# Patient Record
Sex: Female | Born: 1995 | Race: Black or African American | Hispanic: No | Marital: Single | Smoking: Former smoker
Health system: Southern US, Community
[De-identification: ages and names within clinical notes are randomized; demographics above are authoritative.]

---

## 2018-05-03 ENCOUNTER — Emergency Department (HOSPITAL_BASED_OUTPATIENT_CLINIC_OR_DEPARTMENT_OTHER)
Admission: EM | Admit: 2018-05-03 | Discharge: 2018-05-03 | Disposition: A | Discharge status: Home / Self Care | Payer: Medicaid Other | Attending: Emergency Medicine | Admitting: Emergency Medicine

## 2018-05-03 ENCOUNTER — Encounter (HOSPITAL_BASED_OUTPATIENT_CLINIC_OR_DEPARTMENT_OTHER): Payer: Self-pay | Admitting: Emergency Medicine

## 2018-05-03 ENCOUNTER — Other Ambulatory Visit: Payer: Self-pay

## 2018-05-03 DIAGNOSIS — I8393 Asymptomatic varicose veins of bilateral lower extremities: Secondary | ICD-10-CM | POA: Insufficient documentation

## 2018-05-03 DIAGNOSIS — N390 Urinary tract infection, site not specified: Secondary | ICD-10-CM | POA: Insufficient documentation

## 2018-05-03 DIAGNOSIS — R1030 Lower abdominal pain, unspecified: Secondary | ICD-10-CM | POA: Diagnosis present

## 2018-05-03 LAB — URINALYSIS, MICROSCOPIC (REFLEX): RBC / HPF: NONE SEEN RBC/hpf (ref 0–5)

## 2018-05-03 LAB — URINALYSIS, ROUTINE W REFLEX MICROSCOPIC
Bilirubin Urine: NEGATIVE
GLUCOSE, UA: NEGATIVE mg/dL
Hgb urine dipstick: NEGATIVE
KETONES UR: NEGATIVE mg/dL
Nitrite: NEGATIVE
PH: 6 (ref 5.0–8.0)
Protein, ur: NEGATIVE mg/dL
SPECIFIC GRAVITY, URINE: 1.025 (ref 1.005–1.030)

## 2018-05-03 LAB — PREGNANCY, URINE: Preg Test, Ur: NEGATIVE

## 2018-05-03 MED ORDER — NITROFURANTOIN MONOHYD MACRO 100 MG PO CAPS: 100.0000 mg | ORAL_CAPSULE | Freq: Two times a day (BID) | ORAL | 0 refills | Status: DC

## 2018-05-03 MED ORDER — PHENAZOPYRIDINE HCL 100 MG PO TABS
95.0000 mg | ORAL_TABLET | Freq: Once | ORAL | Status: AC
  Administered 2018-05-03: 100 mg via ORAL
  Filled 2018-05-03: qty 1

## 2018-05-03 MED ORDER — PHENAZOPYRIDINE HCL 200 MG PO TABS: 200.0000 mg | ORAL_TABLET | Freq: Three times a day (TID) | ORAL | 0 refills | Status: DC | PRN

## 2018-05-03 MED ORDER — NITROFURANTOIN MONOHYD MACRO 100 MG PO CAPS
100.0000 mg | ORAL_CAPSULE | Freq: Once | ORAL | Status: AC
  Administered 2018-05-03: 100 mg via ORAL
  Filled 2018-05-03: qty 1

## 2018-05-03 NOTE — ED Provider Notes (Signed)
[facility] EMERGENCY DEPARTMENT Provider Note   CSN: [id number] Arrival date & time: 05/03/18  0058     History   Chief Complaint Chief Complaint  Patient presents with  . Abdominal Pain    HPI Joyce Flynn is a 22 y.o. female.  The history is provided by the patient.  Abdominal Pain   This is a new problem. The current episode started yesterday. The problem occurs constantly. The problem has not changed since onset.The pain is associated with an unknown factor. The pain is located in the suprapubic region. The quality of the pain is dull. The pain is moderate. Pertinent negatives include anorexia, fever, belching, diarrhea, flatus, hematochezia, melena, nausea, vomiting, constipation, dysuria, frequency, hematuria, headaches, arthralgias and myalgias. Nothing aggravates the symptoms. Nothing relieves the symptoms. Past workup does not include GI consult. Her past medical history does not include PUD.  Also has chronic leg pain where her spider veins are.  No f/c/r. No vaginal complaints.    History reviewed. No pertinent past medical history.  There are no active problems to display for this patient.   History reviewed. No pertinent surgical history.   OB History   None      Home Medications    Prior to Admission medications   Medication Sig Start Date End Date Taking? Authorizing Provider  nitrofurantoin, macrocrystal-monohydrate, (MACROBID) 100 MG capsule Take 1 capsule (100 mg total) by mouth 2 (two) times daily. X 7 days 05/03/18   Nicanor Alcon, Nimrit Kehres, MD  phenazopyridine (PYRIDIUM) 200 MG tablet Take 1 tablet (200 mg total) by mouth 3 (three) times daily as needed for pain. 05/03/18   Acacia Latorre, MD    Family History No family history on file.  Social History Social History   Tobacco Use  . Smoking status: Never Smoker  . Smokeless tobacco: Never Used  Substance Use Topics  . Alcohol use: Never    Frequency: Never  . Drug use: Never      Allergies   Patient has no known allergies.   Review of Systems Review of Systems  Constitutional: Negative for fever.  Respiratory: Negative for shortness of breath.   Cardiovascular: Negative for chest pain, palpitations and leg swelling.  Gastrointestinal: Positive for abdominal pain. Negative for anorexia, constipation, diarrhea, flatus, hematochezia, melena, nausea and vomiting.  Genitourinary: Negative for dysuria, frequency, hematuria, vaginal bleeding and vaginal discharge.  Musculoskeletal: Negative for arthralgias and myalgias.  Neurological: Negative for headaches.  All other systems reviewed and are negative.    Physical Exam Updated Vital Signs BP (!) 154/86 (BP Location: Right Arm)   Pulse 89   Temp 98.5 F (36.9 C) (Oral)   Resp 18   Ht  (1.676 m)   Wt (!) 142 kg (313 lb)   SpO2 99%   BMI 50.52 kg/m   Physical Exam  Constitutional: She is oriented to person, place, and time. She appears well-developed and well-nourished. No distress.  HENT:  Head: Normocephalic and atraumatic.  Eyes: Pupils are equal, round, and reactive to light. Conjunctivae are normal.  Neck: Normal range of motion. Neck supple.  Cardiovascular: Normal rate, regular rhythm, normal heart sounds and intact distal pulses.  Pulmonary/Chest: Effort normal and breath sounds normal. No stridor. She has no wheezes. She has no rales.  Abdominal: Soft. Normal appearance and bowel sounds are normal. She exhibits no distension and no mass. There is no tenderness. There is no rigidity, no rebound, no guarding, no tenderness at McBurney's point and negative Murphy's  sign. No hernia.  Musculoskeletal: Normal range of motion. She exhibits no edema or tenderness.  Negative Homan's sign  Neurological: She is alert and oriented to person, place, and time. She displays normal reflexes.  Skin: Skin is warm and dry. Capillary refill takes less than 2 seconds.  Psychiatric: She has a normal mood and  affect.  Nursing note and vitals reviewed.    ED Treatments / Results  Labs (all labs ordered are listed, but only abnormal results are displayed) Results for orders placed or performed during the hospital encounter of 05/03/18  Urinalysis, Routine w reflex microscopic  Result Value Ref Range   Color, Urine YELLOW YELLOW   APPearance CLEAR CLEAR   Specific Gravity, Urine 1.025 1.005 - 1.030   pH 6.0 5.0 - 8.0   Glucose, UA NEGATIVE NEGATIVE mg/dL   Hgb urine dipstick NEGATIVE NEGATIVE   Bilirubin Urine NEGATIVE NEGATIVE   Ketones, ur NEGATIVE NEGATIVE mg/dL   Protein, ur NEGATIVE NEGATIVE mg/dL   Nitrite NEGATIVE NEGATIVE   Leukocytes, UA TRACE (A) NEGATIVE  Pregnancy, urine  Result Value Ref Range   Preg Test, Ur NEGATIVE NEGATIVE  Urinalysis, Microscopic (reflex)  Result Value Ref Range   RBC / HPF NONE SEEN 0 - 5 RBC/hpf   WBC, UA 6-10 0 - 5 WBC/hpf   Bacteria, UA MANY (A) NONE SEEN   Squamous Epithelial / LPF 6-10 0 - 5   Mucus PRESENT    No results found.  Radiology No results found.  Procedures Procedures (including critical care time)  Medications Ordered in ED Medications  nitrofurantoin (macrocrystal-monohydrate) (MACROBID) capsule 100 mg (100 mg Oral Given 05/03/18 0253)  phenazopyridine (PYRIDIUM) tablet 100 mg (100 mg Oral Given 05/03/18 0253)      Final Clinical Impressions(s) / ED Diagnoses   Final diagnoses:  Lower urinary tract infectious disease  Spider veins of both lower extremities   Will treat for UTI.  Wear compression stockings for your spider veins.     Return for weakness, numbness, changes in vision or speech, fevers >100.4 unrelieved by medication, shortness of breath, intractable vomiting, or diarrhea, abdominal pain, Inability to tolerate liquids or food, cough, altered mental status or any concerns. No signs of systemic illness or infection. The patient is nontoxic-appearing on exam and vital signs are within normal limits.    I have reviewed the triage vital signs and the nursing notes. Pertinent labs &imaging results that were available during my care of the patient were reviewed by me and considered in my medical decision making (see chart for details).  After history, exam, and medical workup I feel the patient has been appropriately medically screened and is safe for discharge home. Pertinent diagnoses were discussed with the patient. Patient was given return precautions.    ED Discharge Orders        Ordered    nitrofurantoin, macrocrystal-monohydrate, (MACROBID) 100 MG capsule  2 times daily     05/03/18 0246    phenazopyridine (PYRIDIUM) 200 MG tablet  3 times daily PRN     05/03/18 0246       Tanisha Lutes, MD 05/03/18 1610

## 2018-05-03 NOTE — ED Notes (Addendum)
POC discussed with the pt. Pt oriented that there is only one EDP at this time and she will be on her room ASAP. Pt encouraged to change on a hospital gown for better assessment by EDP. Pt verbalized understanding.

## 2018-05-03 NOTE — ED Triage Notes (Signed)
Pt c/o right sided abd pain that started today. Pt also c/o bilateral leg pain that has been going on "for a while."

## 2018-10-22 ENCOUNTER — Emergency Department (HOSPITAL_BASED_OUTPATIENT_CLINIC_OR_DEPARTMENT_OTHER)
Admission: EM | Admit: 2018-10-22 | Discharge: 2018-10-22 | Disposition: A | Discharge status: Home / Self Care | Payer: Medicaid Other | Attending: Emergency Medicine | Admitting: Emergency Medicine

## 2018-10-22 ENCOUNTER — Emergency Department (HOSPITAL_BASED_OUTPATIENT_CLINIC_OR_DEPARTMENT_OTHER): Payer: Medicaid Other

## 2018-10-22 DIAGNOSIS — J029 Acute pharyngitis, unspecified: Secondary | ICD-10-CM | POA: Diagnosis present

## 2018-10-22 DIAGNOSIS — B9789 Other viral agents as the cause of diseases classified elsewhere: Secondary | ICD-10-CM | POA: Diagnosis not present

## 2018-10-22 DIAGNOSIS — J028 Acute pharyngitis due to other specified organisms: Secondary | ICD-10-CM | POA: Insufficient documentation

## 2018-10-22 LAB — GROUP A STREP BY PCR: Group A Strep by PCR: NOT DETECTED

## 2018-10-22 MED ORDER — DEXAMETHASONE SODIUM PHOSPHATE 10 MG/ML IJ SOLN
10.0000 mg | Freq: Once | INTRAMUSCULAR | Status: AC
Start: 2018-10-22 — End: 2018-10-22
  Administered 2018-10-22: 10 mg via INTRAMUSCULAR
  Filled 2018-10-22: qty 1

## 2018-10-22 NOTE — ED Notes (Signed)
Pt understood dc material. NAD noted. 

## 2018-10-22 NOTE — ED Triage Notes (Signed)
Pt states that she has had a sore throat and swollen throat for two days. Denies fevers, chills, N/V. Has had a runny nose and some cough. Hx of tonsillitis and strep. No OTC med used

## 2018-10-22 NOTE — ED Notes (Signed)
Patient transported to X-ray 

## 2018-10-22 NOTE — Discharge Instructions (Addendum)
Your strep test is negative.  Keep yourself hydrated.  Use Tylenol or ibuprofen as needed for pain and fever.  Return to the ED with difficulty breathing, difficulty swallowing or other concerns.

## 2018-10-22 NOTE — ED Provider Notes (Signed)
[facility] EMERGENCY DEPARTMENT Provider Note   CSN: [id number] Arrival date & time: 10/22/18  0306     History   Chief Complaint Chief Complaint  Patient presents with  . Sore Throat    HPI Joyce Flynn is a 22 y.o. female.  Patient reports 3-day history of sore throat and pain with swallowing.  She been taking ibuprofen at home without relief.  Also has had a runny nose and a nonproductive cough.  Has has tonsillitis and strep in the past.  Denies fever, chills, nausea or vomiting.  No sick contacts.  No difficulty breathing but does have pain with swallowing. Denies any headache, abdominal pain, chest pain or shortness of breath.  The history is provided by the patient.  Sore Throat  Pertinent negatives include no chest pain, no abdominal pain and no shortness of breath.    No past medical history on file.  There are no active problems to display for this patient.   No past surgical history on file.   OB History   None      Home Medications    Prior to Admission medications   Medication Sig Start Date End Date Taking? Authorizing Provider  nitrofurantoin, macrocrystal-monohydrate, (MACROBID) 100 MG capsule Take 1 capsule (100 mg total) by mouth 2 (two) times daily. X 7 days 05/03/18   Nicanor Alcon, April, MD  phenazopyridine (PYRIDIUM) 200 MG tablet Take 1 tablet (200 mg total) by mouth 3 (three) times daily as needed for pain. 05/03/18   Palumbo, April, MD    Family History No family history on file.  Social History Social History   Tobacco Use  . Smoking status: Never Smoker  . Smokeless tobacco: Never Used  Substance Use Topics  . Alcohol use: Never    Frequency: Never  . Drug use: Never     Allergies   Patient has no known allergies.   Review of Systems Review of Systems  Constitutional: Negative for activity change, appetite change and fever.  HENT: Positive for congestion, rhinorrhea, sore throat and trouble swallowing.   Eyes:  Negative for visual disturbance.  Respiratory: Positive for cough. Negative for shortness of breath.   Cardiovascular: Negative for chest pain.  Gastrointestinal: Negative for abdominal pain, nausea and vomiting.  Genitourinary: Negative for dysuria, hematuria, vaginal bleeding and vaginal discharge.  Musculoskeletal: Negative for arthralgias and myalgias.  Neurological: Negative for dizziness, weakness and light-headedness.    all other systems are negative except as noted in the HPI and PMH.    Physical Exam Updated Vital Signs BP (!) 148/88 (BP Location: Left Arm)   Pulse 82   Temp 98.2 F (36.8 C) (Oral)   Resp 20   Ht 5\' 6"  (1.676 m)   Wt (!) 144 kg   SpO2 100%   BMI 51.24 kg/m   Physical Exam  Constitutional: She is oriented to person, place, and time. She appears well-developed and well-nourished. No distress.  HENT:  Head: Normocephalic and atraumatic.  Mouth/Throat: Oropharynx is clear and moist. No oropharyngeal exudate.  Uvula midline, erythema present without exudate, no asymmetry, floor mouth is soft No swelling of soft palate  Eyes: Pupils are equal, round, and reactive to light. Conjunctivae and EOM are normal.  Neck: Normal range of motion. Neck supple.  No meningismus.  Cardiovascular: Normal rate, regular rhythm, normal heart sounds and intact distal pulses.  No murmur heard. Pulmonary/Chest: Effort normal and breath sounds normal. No respiratory distress. She exhibits no tenderness.  Abdominal: Soft. There  is no tenderness. There is no rebound and no guarding.  Musculoskeletal: Normal range of motion. She exhibits no edema or tenderness.  Lymphadenopathy:    She has cervical adenopathy.  Neurological: She is alert and oriented to person, place, and time. No cranial nerve deficit. She exhibits normal muscle tone. Coordination normal.   5/5 strength throughout. CN 2-12 intact.Equal grip strength.   Skin: Skin is warm.  Psychiatric: She has a normal mood  and affect. Her behavior is normal.  Nursing note and vitals reviewed.    ED Treatments / Results  Labs (all labs ordered are listed, but only abnormal results are displayed) Labs Reviewed  GROUP A STREP BY PCR    EKG None  Radiology Dg Neck Soft Tissue  Result Date: 10/22/2018 CLINICAL DATA:  22 year old female with sore throat. EXAM: NECK SOFT TISSUES - 1+ VIEW COMPARISON:  None. FINDINGS: There is no evidence of retropharyngeal soft tissue swelling or epiglottic enlargement. The cervical airway is unremarkable and no radio-opaque foreign body identified. IMPRESSION: Negative. Electronically Signed   By: Elgie Collard M.D.   On: 10/22/2018 04:46    Procedures Procedures (including critical care time)  Medications Ordered in ED Medications - No data to display   Initial Impression / Assessment and Plan / ED Course  I have reviewed the triage vital signs and the nursing notes.  Pertinent labs & imaging results that were available during my care of the patient were reviewed by me and considered in my medical decision making (see chart for details).    3 days of sore throat pain with swallowing.  Uvula midline.  No evidence of peritonsillar abscess.  Rapid strep is negative.  Soft tissue neck x-ray is negative.  Patient controlling secretions and in no distress. IM Decadron given  We will treat supportively for likely viral pharyngitis.  Follow-up with PCP.  Return precautions discussed.  Final Clinical Impressions(s) / ED Diagnoses   Final diagnoses:  Viral pharyngitis    ED Discharge Orders    None       Meha Vidrine, Jeannett Senior, MD 10/22/18 [phone]

## 2018-10-25 ENCOUNTER — Emergency Department (HOSPITAL_BASED_OUTPATIENT_CLINIC_OR_DEPARTMENT_OTHER)
Admission: EM | Admit: 2018-10-25 | Discharge: 2018-10-25 | Disposition: A | Discharge status: Home / Self Care | Payer: Medicaid Other | Attending: Emergency Medicine | Admitting: Emergency Medicine

## 2018-10-25 ENCOUNTER — Other Ambulatory Visit: Payer: Self-pay

## 2018-10-25 ENCOUNTER — Encounter (HOSPITAL_BASED_OUTPATIENT_CLINIC_OR_DEPARTMENT_OTHER): Payer: Self-pay | Admitting: Emergency Medicine

## 2018-10-25 DIAGNOSIS — J029 Acute pharyngitis, unspecified: Secondary | ICD-10-CM | POA: Insufficient documentation

## 2018-10-25 DIAGNOSIS — R070 Pain in throat: Secondary | ICD-10-CM | POA: Diagnosis present

## 2018-10-25 LAB — COMPREHENSIVE METABOLIC PANEL
ALT: 28 U/L (ref 0–44)
ANION GAP: 8 (ref 5–15)
AST: 26 U/L (ref 15–41)
Albumin: 3.7 g/dL (ref 3.5–5.0)
Alkaline Phosphatase: 54 U/L (ref 38–126)
BUN: 15 mg/dL (ref 6–20)
CALCIUM: 8.5 mg/dL — AB (ref 8.9–10.3)
CHLORIDE: 107 mmol/L (ref 98–111)
CO2: 23 mmol/L (ref 22–32)
CREATININE: 0.88 mg/dL (ref 0.44–1.00)
GFR calc non Af Amer: >60 mL/min (ref >60)
Glucose, Bld: 84 mg/dL (ref 70–99)
Potassium: 3.8 mmol/L (ref 3.5–5.1)
SODIUM: 138 mmol/L (ref 135–145)
Total Bilirubin: 0.6 mg/dL (ref 0.3–1.2)
Total Protein: 7.6 g/dL (ref 6.5–8.1)

## 2018-10-25 LAB — CBC WITH DIFFERENTIAL/PLATELET
Abs Immature Granulocytes: 0.03 10*3/uL (ref 0.00–0.07)
BASOS ABS: 0 10*3/uL (ref 0.0–0.1)
BASOS PCT: 0 %
EOS ABS: 0.2 10*3/uL (ref 0.0–0.5)
EOS PCT: 2 %
HCT: 41.5 % (ref 36.0–46.0)
Hemoglobin: 12.7 g/dL (ref 12.0–15.0)
Immature Granulocytes: 0 %
LYMPHS ABS: 4.9 10*3/uL — AB (ref 0.7–4.0)
LYMPHS PCT: 41 %
MCH: 27.1 pg (ref 26.0–34.0)
MCHC: 30.6 g/dL (ref 30.0–36.0)
MCV: 88.7 fL (ref 80.0–100.0)
Monocytes Absolute: 0.8 10*3/uL (ref 0.1–1.0)
Monocytes Relative: 7 %
NRBC: 0 % (ref 0.0–0.2)
Neutro Abs: 6 10*3/uL (ref 1.7–7.7)
Neutrophils Relative %: 50 %
Platelets: 209 10*3/uL (ref 150–400)
RBC: 4.68 MIL/uL (ref 3.87–5.11)
RDW: 12.6 % (ref 11.5–15.5)
WBC: 12 10*3/uL — ABNORMAL HIGH (ref 4.0–10.5)

## 2018-10-25 LAB — MONONUCLEOSIS SCREEN: MONO SCREEN: NEGATIVE

## 2018-10-25 MED ORDER — AMOXICILLIN 500 MG PO CAPS: 500.0000 mg | ORAL_CAPSULE | Freq: Two times a day (BID) | ORAL | 0 refills | Status: DC

## 2018-10-25 NOTE — ED Triage Notes (Signed)
Pt states she was seen last Friday for sore throat and got a steroid shot, feel well on Saturday but now her sore throat is worse.

## 2018-10-25 NOTE — ED Provider Notes (Signed)
[facility] EMERGENCY DEPARTMENT Provider Note   CSN: [id number] Arrival date & time: 10/25/18  0152     History   Chief Complaint Chief Complaint  Patient presents with  . Sore Throat    HPI Joyce Flynn is a 22 y.o. female.  Patient returns with sore throat.  Seen by myself 3 days ago for sore throat with negative strep test.  Was treated with steroids and supportive care for likely viral pharyngitis.  States she felt better for about 1 day then started hurting her throat again.  She describes pain in the center of her throat that is worse with swallowing.  Has had cough and runny nose as well.  No fever, chills, nausea or vomiting.  No chest pain or shortness of breath.  No sick contacts or rashes.  No abdominal pain or headache. Noticed that her right eye was red when she woke up yesterday morning. No visual changes.  The history is provided by the patient.  Sore Throat  Pertinent negatives include no chest pain, no abdominal pain and no headaches.    History reviewed. No pertinent past medical history.  There are no active problems to display for this patient.   History reviewed. No pertinent surgical history.   OB History   None      Home Medications    Prior to Admission medications   Medication Sig Start Date End Date Taking? Authorizing Provider  nitrofurantoin, macrocrystal-monohydrate, (MACROBID) 100 MG capsule Take 1 capsule (100 mg total) by mouth 2 (two) times daily. X 7 days 05/03/18   Nicanor Alcon, April, MD  phenazopyridine (PYRIDIUM) 200 MG tablet Take 1 tablet (200 mg total) by mouth 3 (three) times daily as needed for pain. 05/03/18   Palumbo, April, MD    Family History No family history on file.  Social History Social History   Tobacco Use  . Smoking status: Never Smoker  . Smokeless tobacco: Never Used  Substance Use Topics  . Alcohol use: Never    Frequency: Never  . Drug use: Never     Allergies   Patient has no known  allergies.   Review of Systems Review of Systems  Constitutional: Negative for activity change, appetite change and fatigue.  HENT: Positive for congestion, rhinorrhea, sore throat and trouble swallowing.   Respiratory: Positive for cough. Negative for chest tightness.   Cardiovascular: Negative for chest pain and leg swelling.  Gastrointestinal: Negative for abdominal pain, nausea and vomiting.  Genitourinary: Negative for dysuria and hematuria.  Musculoskeletal: Negative for arthralgias and myalgias.  Skin: Negative for rash.  Neurological: Negative for dizziness, weakness and headaches.    all other systems are negative except as noted in the HPI and PMH.    Physical Exam Updated Vital Signs BP (!) 146/79 (BP Location: Right Arm)   Pulse 95   Temp 98.1 F (36.7 C) (Oral)   Ht 5\' 6"  (1.676 m)   Wt (!) 142 kg   LMP 10/19/2018   SpO2 100%   BMI 50.52 kg/m   Physical Exam  Constitutional: She is oriented to person, place, and time. She appears well-developed and well-nourished. No distress.  No distress. Speaking in full sentences  HENT:  Head: Normocephalic and atraumatic.  Mouth/Throat: Oropharynx is clear and moist. No oropharyngeal exudate.  Erythematous oropharynx.  No asymmetry or bulging of soft palate.  Uvula is midline.  No exudate.  Floor of mouth is soft.  Eyes: Pupils are equal, round, and reactive to light. Conjunctivae  and EOM are normal.  Neck: Normal range of motion. Neck supple.  No meningismus.  Cardiovascular: Normal rate, regular rhythm, normal heart sounds and intact distal pulses.  No murmur heard. Pulmonary/Chest: Effort normal and breath sounds normal. No respiratory distress. She exhibits no tenderness.  Abdominal: Soft. There is no tenderness. There is no rebound and no guarding.  Musculoskeletal: Normal range of motion. She exhibits no edema or tenderness.  Neurological: She is alert and oriented to person, place, and time. No cranial nerve  deficit. She exhibits normal muscle tone. Coordination normal.  No ataxia on finger to nose bilaterally. No pronator drift. 5/5 strength throughout. CN 2-12 intact.Equal grip strength. Sensation intact.   Skin: Skin is warm.  Psychiatric: She has a normal mood and affect. Her behavior is normal.  Nursing note and vitals reviewed.    ED Treatments / Results  Labs (all labs ordered are listed, but only abnormal results are displayed) Labs Reviewed  CBC WITH DIFFERENTIAL/PLATELET - Abnormal; Notable for the following components:      Result Value   WBC 12.0 (*)    Lymphs Abs 4.9 (*)    All other components within normal limits  COMPREHENSIVE METABOLIC PANEL - Abnormal; Notable for the following components:   Calcium 8.5 (*)    All other components within normal limits  MONONUCLEOSIS SCREEN    EKG None  Radiology No results found.  Procedures Procedures (including critical care time)  Medications Ordered in ED Medications - No data to display   Initial Impression / Assessment and Plan / ED Course  I have reviewed the triage vital signs and the nursing notes.  Pertinent labs & imaging results that were available during my care of the patient were reviewed by me and considered in my medical decision making (see chart for details).    Return visit for sore throat.  Did feel better for 1 day but symptoms returned.  No difficulty breathing or difficulty swallowing.  No evidence of peritonsillar abscess on exam. Soft tissue Xray negative on 11/2  Rapid strep test from 2 days ago was negative.  Lab work obtained today shows leukocytosis.  Monospot is negative.  Patient in no distress.  No evidence of peritonsillar abscess on exam.  Discussed with patient the next step in evaluation will be CT scan to evaluate for deep space infection but this seems unlikely on exam.  She has no change in voice, stridor, difficulty with her secretions or drooling and no asymmetry of her  oropharynx.  Risks and benefits of CT scan discussed and patient declines at this time. Advised p.o. hydration, antipyretics and PCP follow-up.  Will also give wait-and-see prescription of antibiotics if she is not improving in 2 days. Return to the ED with difficulty breathing, difficulty swallowing or any other concerns. Final Clinical Impressions(s) / ED Diagnoses   Final diagnoses:  Pharyngitis, unspecified etiology    ED Discharge Orders    None       Amerika Nourse, Jeannett Senior, MD 10/25/18 [phone]

## 2018-10-25 NOTE — Discharge Instructions (Signed)
Keep yourself hydrated.  Use Tylenol or ibuprofen as needed for pain.  As we discussed, your sore throat is likely viral.  However if you are still having pain in 2 days you may start the antibiotics.  Return to the ED with difficulty swallowing, difficulty breathing, any other concerns.

## 2019-01-06 ENCOUNTER — Encounter (HOSPITAL_BASED_OUTPATIENT_CLINIC_OR_DEPARTMENT_OTHER): Payer: Self-pay

## 2019-01-06 ENCOUNTER — Emergency Department (HOSPITAL_BASED_OUTPATIENT_CLINIC_OR_DEPARTMENT_OTHER)
Admission: EM | Admit: 2019-01-06 | Discharge: 2019-01-07 | Disposition: A | Discharge status: Home / Self Care | Payer: Medicaid Other | Attending: Emergency Medicine | Admitting: Emergency Medicine

## 2019-01-06 ENCOUNTER — Other Ambulatory Visit: Payer: Self-pay

## 2019-01-06 DIAGNOSIS — N898 Other specified noninflammatory disorders of vagina: Secondary | ICD-10-CM | POA: Diagnosis not present

## 2019-01-06 DIAGNOSIS — R102 Pelvic and perineal pain: Secondary | ICD-10-CM | POA: Diagnosis present

## 2019-01-06 DIAGNOSIS — F17200 Nicotine dependence, unspecified, uncomplicated: Secondary | ICD-10-CM | POA: Insufficient documentation

## 2019-01-06 DIAGNOSIS — B9689 Other specified bacterial agents as the cause of diseases classified elsewhere: Secondary | ICD-10-CM | POA: Diagnosis not present

## 2019-01-06 DIAGNOSIS — N760 Acute vaginitis: Secondary | ICD-10-CM | POA: Diagnosis not present

## 2019-01-06 LAB — URINALYSIS, MICROSCOPIC (REFLEX)

## 2019-01-06 LAB — URINALYSIS, ROUTINE W REFLEX MICROSCOPIC
BILIRUBIN URINE: NEGATIVE
Glucose, UA: NEGATIVE mg/dL
HGB URINE DIPSTICK: NEGATIVE
Ketones, ur: NEGATIVE mg/dL
Nitrite: NEGATIVE
PH: 7 (ref 5.0–8.0)
Protein, ur: NEGATIVE mg/dL
SPECIFIC GRAVITY, URINE: 1.02 (ref 1.005–1.030)

## 2019-01-06 LAB — WET PREP, GENITAL
Sperm: NONE SEEN
Trich, Wet Prep: NONE SEEN
Yeast Wet Prep HPF POC: NONE SEEN

## 2019-01-06 LAB — PREGNANCY, URINE: Preg Test, Ur: NEGATIVE

## 2019-01-06 NOTE — ED Triage Notes (Addendum)
C/o vaginal sores x 9 days-vaginal d/c x 5 days-pt seen at [facility] last [facility]-sores/discharge not assessed-dx with UTI-completed abx today-NAD-steady gait

## 2019-01-06 NOTE — ED Notes (Signed)
Pt instructed to undress for exam. Pelvic cart at bedside

## 2019-01-07 MED ORDER — METRONIDAZOLE 500 MG PO TABS: 500.0000 mg | ORAL_TABLET | Freq: Two times a day (BID) | ORAL | 0 refills | Status: AC

## 2019-01-07 MED ORDER — ACYCLOVIR 400 MG PO TABS: 400.0000 mg | ORAL_TABLET | Freq: Three times a day (TID) | ORAL | 0 refills | Status: AC

## 2019-01-07 NOTE — ED Provider Notes (Signed)
[facility] EMERGENCY DEPARTMENT Provider Note   CSN: [id number] Arrival date & time: 01/06/19  2209     History   Chief Complaint Chief Complaint  Patient presents with  . Vaginal sores    HPI Joyce Flynn is a 23 y.o. female.  The history is provided by the patient.  Patient presents with vaginal pain and sores for up to 9 days.  She reports it is worsening.  Hurts worse to palpation, movement and urination.  She was seen recently and treated for UTI which she has completed antibiotics.  No fever/vomiting.  No vaginal bleeding.  Minimal vaginal discharge.  She is sexually active with multiple partners.  She does report using condoms  OB History   No obstetric history on file.      Home Medications    Prior to Admission medications   Medication Sig Start Date End Date Taking? Authorizing Provider  acyclovir (ZOVIRAX) 400 MG tablet Take 1 tablet (400 mg total) by mouth 3 (three) times daily. 01/07/19   Zadie Rhine, MD  metroNIDAZOLE (FLAGYL) 500 MG tablet Take 1 tablet (500 mg total) by mouth 2 (two) times daily. One po bid x 7 days 01/07/19   Zadie Rhine, MD    Family History No family history on file.  Social History Social History   Tobacco Use  . Smoking status: Current Every Day Smoker  . Smokeless tobacco: Never Used  Substance Use Topics  . Alcohol use: Never    Frequency: Never  . Drug use: Never     Allergies   Patient has no known allergies.   Review of Systems Review of Systems  Constitutional: Negative for fever.  Gastrointestinal: Negative for vomiting.  Genitourinary: Positive for vaginal pain. Negative for vaginal bleeding.  All other systems reviewed and are negative.    Physical Exam Updated Vital Signs BP 140/90 (BP Location: Left Arm)   Pulse 94   Temp 98.1 F (36.7 C) (Oral)   Resp 20   Ht 1.676 m (5\' 6" )   Wt (!) 140.2 kg   LMP 11/22/2018   SpO2 99%   BMI 49.87 kg/m   Physical Exam  CONSTITUTIONAL:  Well developed/well nourished HEAD: Normocephalic/atraumatic EYES: EOMI/PERRL ENMT: Mucous membranes moist NECK: supple no meningeal signs SPINE/BACK:entire spine nontender CV: S1/S2 noted, no murmurs/rubs/gallops noted LUNGS: Lungs are clear to auscultation bilaterally, no apparent distress ABDOMEN: soft, nontender, no rebound or guarding, bowel sounds noted throughout abdomen GU:no cva tenderness Multiple lesions noted in vaginal region consistent with herpes.  Whitish discharge noted.  No vaginal bleeding.  No CMT.  Female chaperone present NEURO: Pt is awake/alert/appropriate, moves all extremitiesx4.  No facial droop.   EXTREMITIES: pulses normal/equal, full ROM SKIN: warm, color normal PSYCH: no abnormalities of mood noted, alert and oriented to situation  ED Treatments / Results  Labs (all labs ordered are listed, but only abnormal results are displayed) Labs Reviewed  WET PREP, GENITAL - Abnormal; Notable for the following components:      Result Value   Clue Cells Wet Prep HPF POC PRESENT (*)    WBC, Wet Prep HPF POC MANY (*)    All other components within normal limits  URINALYSIS, ROUTINE W REFLEX MICROSCOPIC - Abnormal; Notable for the following components:   APPearance HAZY (*)    Leukocytes, UA TRACE (*)    All other components within normal limits  URINALYSIS, MICROSCOPIC (REFLEX) - Abnormal; Notable for the following components:   Bacteria, UA MANY (*)  All other components within normal limits  HSV CULTURE AND TYPING  PREGNANCY, URINE  HIV ANTIBODY (ROUTINE TESTING W REFLEX)  RPR  GC/CHLAMYDIA PROBE AMP ([facility]) NOT AT [facility]    EKG None  Radiology No results found.  Procedures Procedures (including critical care time)  Medications Ordered in ED Medications - No data to display   Initial Impression / Assessment and Plan / ED Course  I have reviewed the triage vital signs and the nursing notes.  Pertinent labs results that were available  during my care of the patient were reviewed by me and considered in my medical decision making (see chart for details).     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  Will start acyclovir and Flagyl.  Extensive STD testing still pending.  Advised to abstain from sexual activity.  She needs close follow-up with OB/GYN as she has never had a Pap smear. We discussed safe sex precautions.  Final Clinical Impressions(s) / ED Diagnoses   Final diagnoses:  Acute vaginitis  BV (bacterial vaginosis)    ED Discharge Orders         Ordered    metroNIDAZOLE (FLAGYL) 500 MG tablet  2 times daily     01/07/19 0007    acyclovir (ZOVIRAX) 400 MG tablet  3 times daily     01/07/19 0007           Zadie Rhine, MD 01/07/19 0015

## 2019-01-07 NOTE — ED Notes (Signed)
Pt understood dc material. NAD noted. Scripts sent in electronically  

## 2019-01-08 LAB — RPR: RPR: NONREACTIVE

## 2019-01-08 LAB — HIV ANTIBODY (ROUTINE TESTING W REFLEX): HIV SCREEN 4TH GENERATION: NONREACTIVE

## 2019-01-09 LAB — HSV CULTURE AND TYPING

## 2019-01-09 LAB — GC/CHLAMYDIA PROBE AMP (~~LOC~~) NOT AT ARMC
CHLAMYDIA, DNA PROBE: NEGATIVE
Neisseria Gonorrhea: NEGATIVE

## 2021-05-16 ENCOUNTER — Emergency Department (HOSPITAL_BASED_OUTPATIENT_CLINIC_OR_DEPARTMENT_OTHER)
Admission: EM | Admit: 2021-05-16 | Discharge: 2021-05-16 | Disposition: A | Discharge status: Home / Self Care | Payer: Medicaid Other | Attending: Emergency Medicine | Admitting: Emergency Medicine

## 2021-05-16 ENCOUNTER — Other Ambulatory Visit: Payer: Self-pay

## 2021-05-16 ENCOUNTER — Encounter (HOSPITAL_BASED_OUTPATIENT_CLINIC_OR_DEPARTMENT_OTHER): Payer: Self-pay | Admitting: *Deleted

## 2021-05-16 DIAGNOSIS — Z87891 Personal history of nicotine dependence: Secondary | ICD-10-CM | POA: Insufficient documentation

## 2021-05-16 DIAGNOSIS — J029 Acute pharyngitis, unspecified: Secondary | ICD-10-CM | POA: Insufficient documentation

## 2021-05-16 DIAGNOSIS — R070 Pain in throat: Secondary | ICD-10-CM | POA: Diagnosis present

## 2021-05-16 MED ORDER — DEXAMETHASONE 6 MG PO TABS
10.0000 mg | ORAL_TABLET | Freq: Once | ORAL | Status: AC
  Administered 2021-05-16: 10 mg via ORAL
  Filled 2021-05-16: qty 1

## 2021-05-16 MED ORDER — PENICILLIN G BENZATHINE & PROC 1200000 UNIT/2ML IM SUSP
1.2000 10*6.[IU] | Freq: Once | INTRAMUSCULAR | Status: AC
  Administered 2021-05-16: 1.2 10*6.[IU] via INTRAMUSCULAR
  Filled 2021-05-16: qty 2

## 2021-05-16 NOTE — ED Provider Notes (Signed)
[facility] EMERGENCY DEPARTMENT Provider Note   CSN: [id number] Arrival date & time: 05/16/21  2012     History Chief Complaint  Patient presents with  . Sore Throat    Joyce Flynn is a 25 y.o. female.  Sore throat, no fever.  No voice change.  The history is provided by the patient.  Sore Throat This is a new problem. The problem occurs constantly. The problem has not changed since onset.Nothing aggravates the symptoms. Nothing relieves the symptoms. She has tried nothing for the symptoms. The treatment provided no relief.       History reviewed. No pertinent past medical history.  There are no problems to display for this patient.   History reviewed. No pertinent surgical history.   OB History   No obstetric history on file.     No family history on file.  Social History   Tobacco Use  . Smoking status: Former Games developer  . Smokeless tobacco: Never Used  Vaping Use  . Vaping Use: Never used  Substance Use Topics  . Alcohol use: Yes  . Drug use: Never    Home Medications Prior to Admission medications   Medication Sig Start Date End Date Taking? Authorizing Provider  acyclovir (ZOVIRAX) 400 MG tablet Take 1 tablet (400 mg total) by mouth 3 (three) times daily. 01/07/19   Zadie Rhine, MD  metroNIDAZOLE (FLAGYL) 500 MG tablet Take 1 tablet (500 mg total) by mouth 2 (two) times daily. One po bid x 7 days 01/07/19   Zadie Rhine, MD  valACYclovir (VALTREX) 500 MG tablet Take by mouth. 03/18/20   [provider]    Allergies    Patient has no known allergies.  Review of Systems   Review of Systems  Constitutional: Negative for fever.  HENT: Positive for sore throat. Negative for congestion, ear pain, facial swelling, sinus pressure, sinus pain, trouble swallowing and voice change.     Physical Exam Updated Vital Signs BP (!) 139/93 (BP Location: Right Arm)   Pulse (!) 107   Temp 99.6 F (37.6 C) (Oral)   Resp 20   Ht 5\' 6"   (1.676 m)   Wt (!) 145.7 kg   LMP 05/02/2021   SpO2 98%   BMI 51.84 kg/m   Physical Exam Constitutional:      General: She is not in acute distress.    Appearance: She is not ill-appearing.  HENT:     Head: Normocephalic and atraumatic.     Mouth/Throat:     Mouth: Mucous membranes are moist. No oral lesions.     Pharynx: Uvula midline. Oropharyngeal exudate and posterior oropharyngeal erythema present. No pharyngeal swelling or uvula swelling.     Tonsils: Tonsillar exudate present. No tonsillar abscesses. 2+ on the right. 2+ on the left.  Eyes:     Conjunctiva/sclera: Conjunctivae normal.  Musculoskeletal:     Cervical back: Normal range of motion.  Neurological:     Mental Status: She is alert.     ED Results / Procedures / Treatments   Labs (all labs ordered are listed, but only abnormal results are displayed) Labs Reviewed - No data to display  EKG None  Radiology No results found.  Procedures Procedures   Medications Ordered in ED Medications  penicillin g procaine-penicillin g benzathine (BICILLIN-CR) injection 600000-600000 units (1.2 Million Units Intramuscular Given 05/16/21 2030)  dexamethasone (DECADRON) tablet 10 mg (10 mg Oral Given 05/16/21 2030)    ED Course  I have reviewed the triage  vital signs and the nursing notes.  Pertinent labs & imaging results that were available during my care of the patient were reviewed by me and considered in my medical decision making (see chart for details).    MDM Rules/Calculators/A&P                          Joyce Flynn is here with sore throat.  Appears to have pharyngitis.  Will cover for strep with pen G shot.  Will give Decadron.  No concern for abscess or deeper space infection.  Discharged in good condition.  This chart was dictated using voice recognition software.  Despite best efforts to proofread,  errors can occur which can change the documentation meaning.   Final Clinical Impression(s) / ED  Diagnoses Final diagnoses:  Pharyngitis, unspecified etiology    Rx / DC Orders ED Discharge Orders    None       Virgina Norfolk, DO 05/16/21 2035

## 2021-05-16 NOTE — ED Triage Notes (Signed)
Sore throat x 2 days

## 2022-01-16 ENCOUNTER — Encounter (HOSPITAL_BASED_OUTPATIENT_CLINIC_OR_DEPARTMENT_OTHER): Payer: Self-pay

## 2022-01-16 ENCOUNTER — Other Ambulatory Visit: Payer: Self-pay

## 2022-01-16 ENCOUNTER — Emergency Department (HOSPITAL_BASED_OUTPATIENT_CLINIC_OR_DEPARTMENT_OTHER)
Admission: EM | Admit: 2022-01-16 | Discharge: 2022-01-16 | Disposition: A | Discharge status: Home / Self Care | Payer: BC Managed Care – PPO | Attending: Emergency Medicine | Admitting: Emergency Medicine

## 2022-01-16 DIAGNOSIS — R112 Nausea with vomiting, unspecified: Secondary | ICD-10-CM | POA: Insufficient documentation

## 2022-01-16 DIAGNOSIS — D72829 Elevated white blood cell count, unspecified: Secondary | ICD-10-CM | POA: Diagnosis not present

## 2022-01-16 DIAGNOSIS — R197 Diarrhea, unspecified: Secondary | ICD-10-CM | POA: Insufficient documentation

## 2022-01-16 DIAGNOSIS — R1010 Upper abdominal pain, unspecified: Secondary | ICD-10-CM | POA: Insufficient documentation

## 2022-01-16 LAB — CBC WITH DIFFERENTIAL/PLATELET
Abs Immature Granulocytes: 0.03 10*3/uL (ref 0.00–0.07)
Basophils Absolute: 0 10*3/uL (ref 0.0–0.1)
Basophils Relative: 0 %
Eosinophils Absolute: 0 10*3/uL (ref 0.0–0.5)
Eosinophils Relative: 0 %
HCT: 44.1 % (ref 36.0–46.0)
Hemoglobin: 14.5 g/dL (ref 12.0–15.0)
Immature Granulocytes: 0 %
Lymphocytes Relative: 10 %
Lymphs Abs: 1.3 10*3/uL (ref 0.7–4.0)
MCH: 28.4 pg (ref 26.0–34.0)
MCHC: 32.9 g/dL (ref 30.0–36.0)
MCV: 86.3 fL (ref 80.0–100.0)
Monocytes Absolute: 0.8 10*3/uL (ref 0.1–1.0)
Monocytes Relative: 6 %
Neutro Abs: 10.3 10*3/uL — ABNORMAL HIGH (ref 1.7–7.7)
Neutrophils Relative %: 84 %
Platelets: 206 10*3/uL (ref 150–400)
RBC: 5.11 MIL/uL (ref 3.87–5.11)
RDW: 12.5 % (ref 11.5–15.5)
WBC: 12.4 10*3/uL — ABNORMAL HIGH (ref 4.0–10.5)
nRBC: 0 % (ref 0.0–0.2)

## 2022-01-16 LAB — LIPASE, BLOOD: Lipase: 28 U/L (ref 11–51)

## 2022-01-16 LAB — COMPREHENSIVE METABOLIC PANEL
ALT: 24 U/L (ref 0–44)
AST: 24 U/L (ref 15–41)
Albumin: 3.9 g/dL (ref 3.5–5.0)
Alkaline Phosphatase: 51 U/L (ref 38–126)
Anion gap: 9 (ref 5–15)
BUN: 14 mg/dL (ref 6–20)
CO2: 22 mmol/L (ref 22–32)
Calcium: 8.7 mg/dL — ABNORMAL LOW (ref 8.9–10.3)
Chloride: 105 mmol/L (ref 98–111)
Creatinine, Ser: 0.67 mg/dL (ref 0.44–1.00)
GFR, Estimated: >60 mL/min (ref >60)
Glucose, Bld: 96 mg/dL (ref 70–99)
Potassium: 4.1 mmol/L (ref 3.5–5.1)
Sodium: 136 mmol/L (ref 135–145)
Total Bilirubin: 0.4 mg/dL (ref 0.3–1.2)
Total Protein: 8.4 g/dL — ABNORMAL HIGH (ref 6.5–8.1)

## 2022-01-16 LAB — PREGNANCY, URINE: Preg Test, Ur: NEGATIVE

## 2022-01-16 MED ORDER — ONDANSETRON HCL 4 MG/2ML IJ SOLN
4.0000 mg | Freq: Once | INTRAMUSCULAR | Status: AC
  Administered 2022-01-16: 4 mg via INTRAVENOUS
  Filled 2022-01-16: qty 2

## 2022-01-16 MED ORDER — SODIUM CHLORIDE 0.9 % IV BOLUS
1000.0000 mL | Freq: Once | INTRAVENOUS | Status: AC
  Administered 2022-01-16: 1000 mL via INTRAVENOUS

## 2022-01-16 MED ORDER — DICYCLOMINE HCL 20 MG PO TABS: 20.0000 mg | ORAL_TABLET | Freq: Two times a day (BID) | ORAL | 0 refills | Status: AC

## 2022-01-16 MED ORDER — ONDANSETRON 4 MG PO TBDP: 4.0000 mg | ORAL_TABLET | Freq: Three times a day (TID) | ORAL | 0 refills | Status: AC | PRN

## 2022-01-16 MED ORDER — DICYCLOMINE HCL 10 MG PO CAPS
10.0000 mg | ORAL_CAPSULE | Freq: Once | ORAL | Status: AC
  Administered 2022-01-16: 10 mg via ORAL
  Filled 2022-01-16: qty 1

## 2022-01-16 NOTE — ED Triage Notes (Signed)
Pt arrives ambulatory to ED with c/o abdominal pain with NVD starting this morning. Pt states she thinks it may be related to something she ate last night. Denies being around anyone with covid or flu. 2 episodes of vomiting, 5 episodes of diarrhea in the last 24 hours.

## 2022-01-16 NOTE — Discharge Instructions (Signed)
Use Tylenol as needed for pain.  If you need further pain control, you may try Bentyl. You Zofran as needed for nausea or vomiting.  Start with liquids and slowly progress your diet. You may continue to have diarrhea for several days.  As long as you are staying hydrated, this will likely resolve on its own. Return to the emergency room if you develop fevers, persistent vomiting despite medication, severe worsening Donnell pain, blood in your stool, or any new, worsening, or concerning symptoms

## 2022-01-16 NOTE — ED Provider Notes (Signed)
[facility] EMERGENCY DEPARTMENT Provider Note   CSN: [id number] Arrival date & time: 01/16/22  1102     History  Chief Complaint  Patient presents with   Abdominal Pain    Joyce Flynn is a 26 y.o. female presenting for evaluation nausea, vomiting, diarrhea.  Patient states her symptoms began around 3:00 this morning.  She reports approximately 5 episodes of diarrhea and 2 episodes of emesis.  No blood in either.  She also has some intermittent sharp upper abdominal pain.  No fevers or chills.  No chest pain, shortness of breath, urinary symptoms.  No other sick contacts.  No history of similar.  Has not taken anything for her symptoms  HPI     Home Medications Prior to Admission medications   Medication Sig Start Date End Date Taking? Authorizing Provider  dicyclomine (BENTYL) 20 MG tablet Take 1 tablet (20 mg total) by mouth 2 (two) times daily. 01/16/22  Yes Abbygail [place]hoite, PA-C  ondansetron (ZOFRAN-ODT) 4 MG disintegrating tablet Take 1 tablet (4 mg total) by mouth every 8 (eight) hours as needed for nausea or vomiting. 01/16/22  Yes Garald Rhew, PA-C  acyclovir (ZOVIRAX) 400 MG tablet Take 1 tablet (400 mg total) by mouth 3 (three) times daily. 01/07/19   Ripley Fraise, MD  metroNIDAZOLE (FLAGYL) 500 MG tablet Take 1 tablet (500 mg total) by mouth 2 (two) times daily. One po bid x 7 days 01/07/19   Ripley Fraise, MD  valACYclovir (VALTREX) 500 MG tablet Take by mouth. 03/18/20   [provider]      Allergies    Patient has no known allergies.    Review of Systems   Review of Systems  Gastrointestinal:  Positive for abdominal pain, diarrhea, nausea and vomiting.  All other systems reviewed and are negative.  Physical Exam Updated Vital Signs BP 122/74    Pulse 82    Temp 98.6 F (37 C) (Oral)    Resp 17    Ht 5\' 6"  (1.676 m)    Wt (!) 147.4 kg    LMP 12/15/2021    SpO2 98%    BMI 52.46 kg/m  Physical Exam Vitals and nursing note  reviewed.  Constitutional:      General: She is not in acute distress.    Appearance: Normal appearance. She is obese.     Comments: Resting in the bed in NAD  HENT:     Head: Normocephalic and atraumatic.  Eyes:     Conjunctiva/sclera: Conjunctivae normal.     Pupils: Pupils are equal, round, and reactive to light.  Cardiovascular:     Rate and Rhythm: Normal rate and regular rhythm.     Pulses: Normal pulses.  Pulmonary:     Effort: Pulmonary effort is normal. No respiratory distress.     Breath sounds: Normal breath sounds. No wheezing.     Comments: Speaking in full sentences.  Clear lung sounds in all fields. Abdominal:     General: There is no distension.     Palpations: Abdomen is soft. There is no mass.     Tenderness: There is no abdominal tenderness. There is no guarding or rebound.     Comments: No TTP of the abdomen.  No rigidity, guarding, distention.  Negative rebound.  No peritonitis  Musculoskeletal:        General: Normal range of motion.     Cervical back: Normal range of motion and neck supple.  Skin:    General: Skin is  warm and dry.     Capillary Refill: Capillary refill takes less than 2 seconds.  Neurological:     Mental Status: She is alert and oriented to person, place, and time.  Psychiatric:        Mood and Affect: Mood and affect normal.        Speech: Speech normal.        Behavior: Behavior normal.    ED Results / Procedures / Treatments   Labs (all labs ordered are listed, but only abnormal results are displayed) Labs Reviewed  CBC WITH DIFFERENTIAL/PLATELET - Abnormal; Notable for the following components:      Result Value   WBC 12.4 (*)    Neutro Abs 10.3 (*)    All other components within normal limits  COMPREHENSIVE METABOLIC PANEL - Abnormal; Notable for the following components:   Calcium 8.7 (*)    Total Protein 8.4 (*)    All other components within normal limits  LIPASE, BLOOD  PREGNANCY, URINE    EKG None  Radiology No  results found.  Procedures Procedures    Medications Ordered in ED Medications  ondansetron (ZOFRAN) injection 4 mg (4 mg Intravenous Given 01/16/22 1308)  sodium chloride 0.9 % bolus 1,000 mL (0 mLs Intravenous Stopped 01/16/22 1432)  dicyclomine (BENTYL) capsule 10 mg (10 mg Oral Given 01/16/22 1410)    ED Course/ Medical Decision Making/ A&P                           Medical Decision Making Amount and/or Complexity of Data Reviewed Labs: ordered.  Risk Prescription drug management.    This patient presents to the ED for concern of nausea, vomiting, diarrhea, abdominal pain.  This involves a number of treatment options, and is a complaint that carries with it a moderate risk of complications and morbidity.  The differential diagnosis includes viral GI illness, food poisoning, pancreatitis, gastritis/GERD/PUD, gallbladder etiology, IBS/IBD.   Lab Tests:  I ordered, and personally interpreted labs.  The pertinent results include: Mild leukocytosis of 12.4.  Likely reactive.  Electrolytes stable.  Kidney function normal, indicating no dehydration.  Liver and pancreatic function normal.  Pregnancy negative.    Medicines ordered:  I ordered medication including Zofran, fluids, Bentyl for symptom control Reevaluation of the patient after these medicines showed that the patient improved Patient tolerating p.o. without difficulty   Test Considered:  As symptoms resolved, labs overall reassuring, and abdominal exam is reassuring, doubt intra-abdominal obstruction, perforation, infection, surgical abdomen.  Do not feel patient needs emergent CT at this time  Disposition:  After consideration of the diagnostic results and the patients response to treatment, I feel that the patent would benefit from symptomatic outpatient management.  Discussed findings with patient.  Discussed overall reassuring work-up.  As she is tolerating p.o. without difficulty, will discharge with symptomatic  medications and have her follow-up with PCP as needed.  At this time, patient appears safe for discharge.  Return precautions given.  Patient states she understands and agrees to plan.   Final Clinical Impression(s) / ED Diagnoses Final diagnoses:  Nausea vomiting and diarrhea  Upper abdominal pain    Rx / DC Orders ED Discharge Orders          Ordered    dicyclomine (BENTYL) 20 MG tablet  2 times daily        01/16/22 1437    ondansetron (ZOFRAN-ODT) 4 MG disintegrating tablet  Every 8 hours  PRN        01/16/22 1437              Franchot Heidelberg, PA-C 01/16/22 1451    [place], MD 01/22/22 [phone]

## 2022-04-30 ENCOUNTER — Other Ambulatory Visit: Payer: Self-pay

## 2022-04-30 ENCOUNTER — Encounter (HOSPITAL_BASED_OUTPATIENT_CLINIC_OR_DEPARTMENT_OTHER): Payer: Self-pay

## 2022-04-30 ENCOUNTER — Emergency Department (HOSPITAL_BASED_OUTPATIENT_CLINIC_OR_DEPARTMENT_OTHER)
Admission: EM | Admit: 2022-04-30 | Discharge: 2022-04-30 | Disposition: A | Discharge status: Home / Self Care | Payer: BC Managed Care – PPO | Attending: Emergency Medicine | Admitting: Emergency Medicine

## 2022-04-30 ENCOUNTER — Emergency Department (HOSPITAL_BASED_OUTPATIENT_CLINIC_OR_DEPARTMENT_OTHER): Payer: BC Managed Care – PPO

## 2022-04-30 DIAGNOSIS — R0789 Other chest pain: Secondary | ICD-10-CM | POA: Diagnosis not present

## 2022-04-30 DIAGNOSIS — I2510 Atherosclerotic heart disease of native coronary artery without angina pectoris: Secondary | ICD-10-CM | POA: Diagnosis not present

## 2022-04-30 DIAGNOSIS — M545 Low back pain, unspecified: Secondary | ICD-10-CM

## 2022-04-30 LAB — COMPREHENSIVE METABOLIC PANEL
ALT: 24 U/L (ref 0–44)
AST: 25 U/L (ref 15–41)
Albumin: 3.4 g/dL — ABNORMAL LOW (ref 3.5–5.0)
Alkaline Phosphatase: 47 U/L (ref 38–126)
Anion gap: 7 (ref 5–15)
BUN: 14 mg/dL (ref 6–20)
CO2: 24 mmol/L (ref 22–32)
Calcium: 8.5 mg/dL — ABNORMAL LOW (ref 8.9–10.3)
Chloride: 107 mmol/L (ref 98–111)
Creatinine, Ser: 0.71 mg/dL (ref 0.44–1.00)
GFR, Estimated: >60 mL/min (ref >60)
Glucose, Bld: 80 mg/dL (ref 70–99)
Potassium: 3.4 mmol/L — ABNORMAL LOW (ref 3.5–5.1)
Sodium: 138 mmol/L (ref 135–145)
Total Bilirubin: 0.6 mg/dL (ref 0.3–1.2)
Total Protein: 7.4 g/dL (ref 6.5–8.1)

## 2022-04-30 LAB — CBC WITH DIFFERENTIAL/PLATELET
Abs Immature Granulocytes: 0.03 10*3/uL (ref 0.00–0.07)
Basophils Absolute: 0 10*3/uL (ref 0.0–0.1)
Basophils Relative: 0 %
Eosinophils Absolute: 0.2 10*3/uL (ref 0.0–0.5)
Eosinophils Relative: 2 %
HCT: 39.3 % (ref 36.0–46.0)
Hemoglobin: 12.8 g/dL (ref 12.0–15.0)
Immature Granulocytes: 0 %
Lymphocytes Relative: 39 %
Lymphs Abs: 3.9 10*3/uL (ref 0.7–4.0)
MCH: 27.9 pg (ref 26.0–34.0)
MCHC: 32.6 g/dL (ref 30.0–36.0)
MCV: 85.8 fL (ref 80.0–100.0)
Monocytes Absolute: 0.9 10*3/uL (ref 0.1–1.0)
Monocytes Relative: 9 %
Neutro Abs: 4.9 10*3/uL (ref 1.7–7.7)
Neutrophils Relative %: 50 %
Platelets: 216 10*3/uL (ref 150–400)
RBC: 4.58 MIL/uL (ref 3.87–5.11)
RDW: 12.8 % (ref 11.5–15.5)
WBC: 10 10*3/uL (ref 4.0–10.5)
nRBC: 0 % (ref 0.0–0.2)

## 2022-04-30 LAB — TROPONIN I (HIGH SENSITIVITY): Troponin I (High Sensitivity): 3 ng/L (ref <18)

## 2022-04-30 LAB — HCG, QUANTITATIVE, PREGNANCY: hCG, Beta Chain, Quant, S: <1 m[IU]/mL (ref <5)

## 2022-04-30 LAB — LIPASE, BLOOD: Lipase: 30 U/L (ref 11–51)

## 2022-04-30 MED ORDER — KETOROLAC TROMETHAMINE 30 MG/ML IJ SOLN
30.0000 mg | Freq: Once | INTRAMUSCULAR | Status: DC
  Filled 2022-04-30: qty 1

## 2022-04-30 MED ORDER — KETOROLAC TROMETHAMINE 30 MG/ML IJ SOLN
30.0000 mg | Freq: Once | INTRAMUSCULAR | Status: AC
  Administered 2022-04-30: 30 mg via INTRAVENOUS

## 2022-04-30 MED ORDER — NAPROXEN 375 MG PO TABS: 375.0000 mg | ORAL_TABLET | Freq: Two times a day (BID) | ORAL | 0 refills | Status: AC

## 2022-04-30 NOTE — ED Provider Notes (Signed)
?[facility] EMERGENCY DEPARTMENT ?Provider Note ? ? ?CSN: [id number] ?Arrival date & time: 04/30/22  1929 ? ?  ? ?History ? ?Chief Complaint  ?Patient presents with  ? Back Pain  ? ? ?Chrystle Murillo is a 26 y.o. female. ? ?26 year old female with no past medical history presents to the ED with a chief complaint of lower back pain for the past 2 weeks.  Also endorsing some chest pain that began about 3 days ago.  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  Does endorse the back pain is worse with bending.  Chest pain is worse with palpation along with bending over and lifting of the left arm.  Has taken Advil, heating pad without any improvement in symptoms.  No falls, no fever, no bowel or bladder incontinence, no prior history of IV drug use. ? ?Pain is worse when she lays down and starts coughing and turned to take a deep breath.  Prior history of CAD, no tobacco use, no family history of CAD. ? ?The history is provided by the patient and medical records.  ?Back Pain ?Location:  Lumbar spine ?Quality:  Aching ?Radiates to:  Does not radiate ?Pain severity:  Moderate ?Pain is:  Worse during the day ?Onset quality:  Gradual ?Duration:  2 weeks ?Timing:  Intermittent ?Progression:  Worsening ?Chronicity:  New ?Context: not jumping from heights, not occupational injury, not physical stress, not recent illness and not recent injury   ?Relieved by:  Nothing ?Worsened by:  Bending ?Ineffective treatments:  Bed rest, NSAIDs and cold packs ?Associated symptoms: chest pain   ?Associated symptoms: no abdominal pain, no abdominal swelling, no bladder incontinence, no bowel incontinence, no fever and no perianal numbness   ?Risk factors: obesity   ?Risk factors: no menopause and not pregnant   ?Chest Pain ?Pain location:  L chest ?Pain quality: aching   ?Pain radiates to:  Does not radiate ?Pain severity:  Mild ?Onset quality:  Sudden ?Duration:  3 days ?Timing:   Intermittent ?Progression:  Partially resolved ?Chronicity:  New ?Context: not drug use   ?Relieved by:  Nothing ?Worsened by:  Exertion ?Ineffective treatments:  None tried ?Associated symptoms: back pain   ?Associated symptoms: no abdominal pain, no fever, no nausea, no shortness of breath and no vomiting   ?Risk factors: obesity   ?Risk factors: no birth control, no diabetes mellitus, no hypertension, not female, no prior DVT/PE and no smoking   ? ?  ? ?Home Medications ?Prior to Admission medications   ?Medication Sig Start Date End Date Taking? Authorizing Provider  ?acyclovir (ZOVIRAX) 400 MG tablet Take 1 tablet (400 mg total) by mouth 3 (three) times daily. 01/07/19   Zadie Rhine, MD  ?dicyclomine (BENTYL) 20 MG tablet Take 1 tablet (20 mg total) by mouth 2 (two) times daily. 01/16/22   Caccavale, Sophia, PA-C  ?metroNIDAZOLE (FLAGYL) 500 MG tablet Take 1 tablet (500 mg total) by mouth 2 (two) times daily. One po bid x 7 days 01/07/19   Zadie Rhine, MD  ?ondansetron (ZOFRAN-ODT) 4 MG disintegrating tablet Take 1 tablet (4 mg total) by mouth every 8 (eight) hours as needed for nausea or vomiting. 01/16/22   Caccavale, Sophia, PA-C  ?valACYclovir (VALTREX) 500 MG tablet Take by mouth. 03/18/20   [provider]  ?   ? ?Allergies    ?Patient has no known allergies.   ? ?Review of Systems   ?Review of Systems  ?Constitutional:  Negative  for fever.  ?Respiratory:  Negative for shortness of breath.   ?Cardiovascular:  Positive for chest pain.  ?Gastrointestinal:  Negative for abdominal pain, bowel incontinence, nausea and vomiting.  ?Genitourinary:  Negative for bladder incontinence.  ?Musculoskeletal:  Positive for back pain.  ?All other systems reviewed and are negative. ? ?Physical Exam ?Updated Vital Signs ?BP (!) 147/116 (BP Location: Left Arm)   Pulse 88   Temp 99.2 ?F (37.3 ?C) (Oral)   Resp 17   Ht 5\' 6"  (1.676 m)   Wt (!) 149.7 kg   LMP 03/26/2022 (Exact Date)   SpO2 100%   BMI 53.26  kg/m?  ?Physical Exam ?Vitals and nursing note reviewed.  ?Constitutional:   ?   General: She is not in acute distress. ?   Appearance: She is well-developed. She is obese.  ?HENT:  ?   Head: Normocephalic and atraumatic.  ?   Mouth/Throat:  ?   Pharynx: No oropharyngeal exudate.  ?Eyes:  ?   Pupils: Pupils are equal, round, and reactive to light.  ?Cardiovascular:  ?   Rate and Rhythm: Regular rhythm.  ?   Heart sounds: Normal heart sounds.  ?Pulmonary:  ?   Effort: Pulmonary effort is normal. No respiratory distress.  ?   Breath sounds: Normal breath sounds.  ?Abdominal:  ?   General: Bowel sounds are normal. There is no distension.  ?   Palpations: Abdomen is soft.  ?   Tenderness: There is no abdominal tenderness.  ?Musculoskeletal:     ?   General: No tenderness or deformity.  ?   Cervical back: Normal range of motion.  ?   Right lower leg: No edema.  ?   Left lower leg: No edema.  ?Skin: ?   General: Skin is warm and dry.  ?Neurological:  ?   Mental Status: She is alert and oriented to person, place, and time.  ? ? ?ED Results / Procedures / Treatments   ?Labs ?(all labs ordered are listed, but only abnormal results are displayed) ?Labs Reviewed  ?COMPREHENSIVE METABOLIC PANEL - Abnormal; Notable for the following components:  ?    Result Value  ? Potassium 3.4 (*)   ? Calcium 8.5 (*)   ? Albumin 3.4 (*)   ? All other components within normal limits  ?HCG, QUANTITATIVE, PREGNANCY  ?CBC WITH DIFFERENTIAL/PLATELET  ?LIPASE, BLOOD  ?TROPONIN I (HIGH SENSITIVITY)  ?TROPONIN I (HIGH SENSITIVITY)  ? ? ?EKG ?None ? ?Radiology ?DG Lumbar Spine Complete ? ?Result Date: 04/30/2022 ?CLINICAL DATA:  Lower back pain x2 weeks. EXAM: LUMBAR SPINE - COMPLETE 4+ VIEW COMPARISON:  None Available. FINDINGS: There is no evidence of lumbar spine fracture. Alignment is normal. Intervertebral disc spaces are maintained. IMPRESSION: Negative. Electronically Signed   By: 06/30/2022 M.D.   On: 04/30/2022 22:12    ? ?Procedures ?Procedures  ? ? ?Medications Ordered in ED ?Medications  ?ketorolac (TORADOL) 30 MG/ML injection 30 mg (has no administration in time range)  ? ? ?ED Course/ Medical Decision Making/ A&P ?  ?                        ?Medical Decision Making ?Patient here with two complaints #1 low back pain which has been ongoing for 2 weeks, she does do heavy lifting while at work.  No red flags, no prior history of IV drug use, no fever.  Some suspicion for musculoskeletal pathology no sciatica present on today's visit.  Reassuring exam with good strength to bilateral lower extremities, lower suspicion for epidural abscess or cauda equina.  Ambulatory in the ED with a steady gait.  X-ray is negative, given Toradol for pain control. ? 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  He does not have any prior history of CAD, nontobacco user, no family history of CAD currently on no medication with a heart score of 1.  Labs are unremarkable, first troponin is negative, delta troponin has not been obtained although I do suspect that this is likely musculoskeletal, reproducible with lifting of her left arm.  I have a lower suspicion for ACS.  Has no shortness of breath, no tachycardia, no hypoxia to suggest pulmonary embolism. ? ?Start patient on anti-inflammatories and have her follow-up outpatient ? ? ?Amount and/or Complexity of Data Reviewed ?Labs: ordered. ?   Details: CBC is unremarkable, hemoglobin is within normal limits.  CMP with no electrolyte derangement, creatinine level is within normal limits.  First troponin is also -3. ?Radiology: ordered and independent interpretation performed. ?   Details: xray of the lumbar spine : No acute findings, i agree with radiology interpretation. ? ? ?First complaint being back pain, no red flags such as fever, IV drug use, bowel or bladder incontinence.  Strength is equal to  bilateral lower extremities, she is ambulatory in the ED with a steady gait. ? ? ?Heart score 1 ? ?Wil start patient on NSAIDS along with follow up with PCP as needed.  ? ?Portions of this note were generated with Scientist, clinical (histocompatibility and immunogenetics)Dragon dictation software

## 2022-04-30 NOTE — Discharge Instructions (Addendum)
Your laboratory results were within normal limit today.The xray of your back did not show any acute findings.  ? ?I have prescribed NSAIDS to help with your symptoms.Please take 1 tablet twice a day for the next 7 days with food. You may also benefit from ice/heat.  ?

## 2022-04-30 NOTE — ED Triage Notes (Signed)
Pt reports lower back pain x 2 weeks; denies recent injury. She is also reporting pain when she pushes down on her chest, states the pain is more topical as opposed to internal as well as pain with inspiration; started about 2-3 days ago. ?

## 2022-04-30 NOTE — ED Notes (Signed)
Pt. C/o low back pain in the lumbar region and sacral region that has been hurting her for 2 weeks now.   ? ?Pt. Also has c/o pain in her mid chest area for several days per Pt. 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 ?

## 2022-05-26 ENCOUNTER — Emergency Department (HOSPITAL_BASED_OUTPATIENT_CLINIC_OR_DEPARTMENT_OTHER)
Admission: EM | Admit: 2022-05-26 | Discharge: 2022-05-26 | Disposition: A | Discharge status: Home / Self Care | Payer: BC Managed Care – PPO | Attending: Emergency Medicine | Admitting: Emergency Medicine

## 2022-05-26 ENCOUNTER — Encounter (HOSPITAL_BASED_OUTPATIENT_CLINIC_OR_DEPARTMENT_OTHER): Payer: Self-pay | Admitting: Emergency Medicine

## 2022-05-26 ENCOUNTER — Other Ambulatory Visit: Payer: Self-pay

## 2022-05-26 DIAGNOSIS — M545 Low back pain, unspecified: Secondary | ICD-10-CM | POA: Diagnosis present

## 2022-05-26 LAB — URINALYSIS, ROUTINE W REFLEX MICROSCOPIC
Bilirubin Urine: NEGATIVE
Glucose, UA: NEGATIVE mg/dL
Hgb urine dipstick: NEGATIVE
Ketones, ur: NEGATIVE mg/dL
Leukocytes,Ua: NEGATIVE
Nitrite: NEGATIVE
Protein, ur: NEGATIVE mg/dL
Specific Gravity, Urine: 1.025 (ref 1.005–1.030)
pH: 6.5 (ref 5.0–8.0)

## 2022-05-26 LAB — PREGNANCY, URINE: Preg Test, Ur: NEGATIVE

## 2022-05-26 MED ORDER — KETOROLAC TROMETHAMINE 15 MG/ML IJ SOLN
15.0000 mg | Freq: Once | INTRAMUSCULAR | Status: AC
  Administered 2022-05-26: 15 mg via INTRAMUSCULAR
  Filled 2022-05-26: qty 1

## 2022-05-26 MED ORDER — LIDOCAINE 5 % EX PTCH
1.0000 | MEDICATED_PATCH | CUTANEOUS | Status: DC
Start: 2022-05-26 — End: 2022-05-27
  Administered 2022-05-26: 1 via TRANSDERMAL
  Filled 2022-05-26: qty 1

## 2022-05-26 MED ORDER — CYCLOBENZAPRINE HCL 10 MG PO TABS: 10.0000 mg | ORAL_TABLET | Freq: Two times a day (BID) | ORAL | 0 refills | Status: AC | PRN

## 2022-05-26 NOTE — ED Notes (Signed)
C/O lower back pain no injury x 1 week

## 2022-05-26 NOTE — ED Triage Notes (Signed)
Pt arrives pov ambulatory c/o lower back pain x 1 week. Seen for same on 5/11. Denies trauma.

## 2022-05-26 NOTE — ED Provider Notes (Signed)
[facility] EMERGENCY DEPARTMENT Provider Note   CSN: [id number] Arrival date & time: 05/26/22  1655     History  Chief Complaint  Patient presents with   Back Pain    Joyce Flynn is a 26 y.o. female with no documented medical history.  The patient presents to the ED for evaluation of low back pain.  Patient states that for the last 1 week she has had right-sided low back pain.  The patient was seen in the ED on 04/30/2022 for complaint of left-sided low back pain.  At this time, the patient had an reassuring work-up to include plain film imaging.  Patient was started on NSAIDs and advised to follow-up with PCP.  Patient states that she does not have a PCP to follow-up with.  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  Patient denies any recent trauma, IV drug use, fevers, chest pain, shortness of breath.  Patient denies any bowel or bladder incontinence, saddle anesthesia, lower extremity weakness or numbness.   Back Pain Associated symptoms: no chest pain, no fever, no numbness and no weakness       Home Medications Prior to Admission medications   Medication Sig Start Date End Date Taking? Authorizing Provider  cyclobenzaprine (FLEXERIL) 10 MG tablet Take 1 tablet (10 mg total) by mouth 2 (two) times daily as needed for muscle spasms. 05/26/22  Yes Al Decant, PA-C  acyclovir (ZOVIRAX) 400 MG tablet Take 1 tablet (400 mg total) by mouth 3 (three) times daily. 01/07/19   Zadie Rhine, MD  dicyclomine (BENTYL) 20 MG tablet Take 1 tablet (20 mg total) by mouth 2 (two) times daily. 01/16/22   Caccavale, Sophia, PA-C  metroNIDAZOLE (FLAGYL) 500 MG tablet Take 1 tablet (500 mg total) by mouth 2 (two) times daily. One po bid x 7 days 01/07/19   Zadie Rhine, MD  ondansetron (ZOFRAN-ODT) 4 MG disintegrating tablet Take 1 tablet (4 mg total) by mouth every 8 (eight) hours as needed for nausea or vomiting.  01/16/22   Caccavale, Sophia, PA-C  valACYclovir (VALTREX) 500 MG tablet Take by mouth. 03/18/20   [provider]      Allergies    Patient has no known allergies.    Review of Systems   Review of Systems  Constitutional:  Negative for fever.  Respiratory:  Negative for shortness of breath.   Cardiovascular:  Negative for chest pain.  Musculoskeletal:  Positive for back pain.  Neurological:  Negative for weakness and numbness.  All other systems reviewed and are negative.  Physical Exam Updated Vital Signs BP (!) 157/88 (BP Location: Left Arm)   Pulse (!) 107   Temp 98.7 F (37.1 C) (Oral)   Resp 18   Ht 5\' 6"  (1.676 m)   Wt (!) 150.6 kg   LMP 05/10/2022 (Approximate)   SpO2 95%   BMI 53.59 kg/m  Physical Exam Vitals and nursing note reviewed.  Constitutional:      General: She is not in acute distress.    Appearance: Normal appearance. She is not ill-appearing, toxic-appearing or diaphoretic.  HENT:     Head: Normocephalic and atraumatic.     Nose: Nose normal. No congestion.     Mouth/Throat:     Mouth: Mucous membranes are moist.     Pharynx: Oropharynx is clear.  Eyes:     Extraocular Movements: Extraocular movements intact.     Conjunctiva/sclera: Conjunctivae normal.  Pupils: Pupils are equal, round, and reactive to light.  Cardiovascular:     Rate and Rhythm: Normal rate and regular rhythm.  Pulmonary:     Effort: Pulmonary effort is normal.     Breath sounds: Normal breath sounds.  Abdominal:     General: Abdomen is flat. Bowel sounds are normal.     Palpations: Abdomen is soft.  Musculoskeletal:     Cervical back: Normal range of motion and neck supple. No tenderness.     Lumbar back: Positive right straight leg raise test.     Comments: Patient states that the pain worsens with straight leg raise on right side.  Patient has very minimal tenderness to the right side lumbar spine.  Skin:    General: Skin is warm and dry.     Capillary  Refill: Capillary refill takes less than 2 seconds.  Neurological:     Mental Status: She is alert and oriented to person, place, and time.    ED Results / Procedures / Treatments   Labs (all labs ordered are listed, but only abnormal results are displayed) Labs Reviewed  PREGNANCY, URINE  URINALYSIS, ROUTINE W REFLEX MICROSCOPIC    EKG None  Radiology No results found.  Procedures Procedures   Medications Ordered in ED Medications  lidocaine (LIDODERM) 5 % 1 patch (1 patch Transdermal Patch Applied 05/26/22 1933)  ketorolac (TORADOL) 15 MG/ML injection 15 mg (15 mg Intramuscular Given 05/26/22 1931)    ED Course/ Medical Decision Making/ A&P                           Medical Decision Making Amount and/or Complexity of Data Reviewed Labs: ordered.  Risk Prescription drug management.   26 year old female presents to the ED for evaluation of low right-sided back pain.  Please see HPI for further details.  On examination, the patient has slight tenderness to palpation of the right side of her lumbar spine area.  There is no crepitus, deformity, step-off of her lumbar spine.  There is no left-sided low back pain or tenderness to palpation.  Patient has positive straight leg raise test.  Patient was provided with 50 mg Toradol IM, Lidoderm patch.  Patient states that after these medications were administered she feels much better.  The patient be discharged home and advised to continue with NSAID therapy.  Patient was counseled on long-term effects of NSAID therapy and educated on signs and symptoms to look out for of GI bleed.  Patient voiced understanding.  Patient also encouraged to pick up Salonpas patches at local pharmacy.  Patient will be referred to PCP for further management.  Patient will also be sent home with muscle relaxers.  Patient advised to not drive or operate heavy machinery under the influence of these medications.  Patient given return precautions and voiced  understanding.  Patient had all of her questions answered to her satisfaction.  The patient is stable at this time for discharge home.   Final Clinical Impression(s) / ED Diagnoses Final diagnoses:  Acute right-sided low back pain without sciatica    Rx / DC Orders ED Discharge Orders          Ordered    cyclobenzaprine (FLEXERIL) 10 MG tablet  2 times daily PRN        05/26/22 2004              Clent RidgesGroce, Harmon Bommarito F, PA-C 05/26/22 2004    Long, Arlyss RepressJoshua G,  MD 05/26/22 2340

## 2022-05-26 NOTE — ED Notes (Signed)
Rx x 1 given  Written and verbal inst to pt  Verbalized an understanding  To home  

## 2022-05-26 NOTE — Discharge Instructions (Signed)
Please return to the ED with any new symptoms such as inability to control bowel or bladder, numbness in your groin, lower extremity weakness Please follow-up with the PCP I referred you to.  Please call and make an appointment to be seen. Please read the attached informational guide concerning acute back pain in adults Please pick up medication at sent in for you.  You may also utilize [employer] which are over-the-counter. Please continue taking ibuprofen or Tylenol for pain relief.  If you begin to develop abdominal pain, nausea or vomiting, blood in stool please stop taking NSAIDs.

## 2022-05-26 NOTE — ED Provider Triage Note (Signed)
Emergency Medicine Provider Triage Evaluation Note  Roxana Lai , a 26 y.o. female  was evaluated in triage.  Pt complains of back pain, history of similar. NSAIDs helped but now worse. No urinary sx. No fever, injury, prior back surgery.  Review of Systems  Positive: Back pain Negative: Urinary sx  Physical Exam  BP (!) 157/88 (BP Location: Left Arm)   Pulse (!) 107   Temp 98.7 F (37.1 C) (Oral)   Resp 18   Ht 5\' 6"  (1.676 m)   Wt (!) 150.6 kg   LMP 05/10/2022 (Approximate)   SpO2 95%   BMI 53.59 kg/m  Gen:   Awake, no distress   Resp:  Normal effort  MSK:   Moves extremities without difficulty  Other:  No TTP of lumbar spine at midline or paraspinal musculature.  Medical Decision Making  Medically screening exam initiated at 6:21 PM.  Appropriate orders placed.  Simonne Convey was informed that the remainder of the evaluation will be completed by another provider, this initial triage assessment does not replace that evaluation, and the importance of remaining in the ED until their evaluation is complete.     05/12/2022, PA-C 05/26/22 1822

## 2022-10-21 IMAGING — DX DG LUMBAR SPINE COMPLETE 4+V
5 series · 5 of 5 positions shown · non-contrast
Comparison: None Available.

CLINICAL DATA: Lower back pain x2 weeks.

EXAM:
LUMBAR SPINE - COMPLETE 4+ VIEW

[l-spine ap]
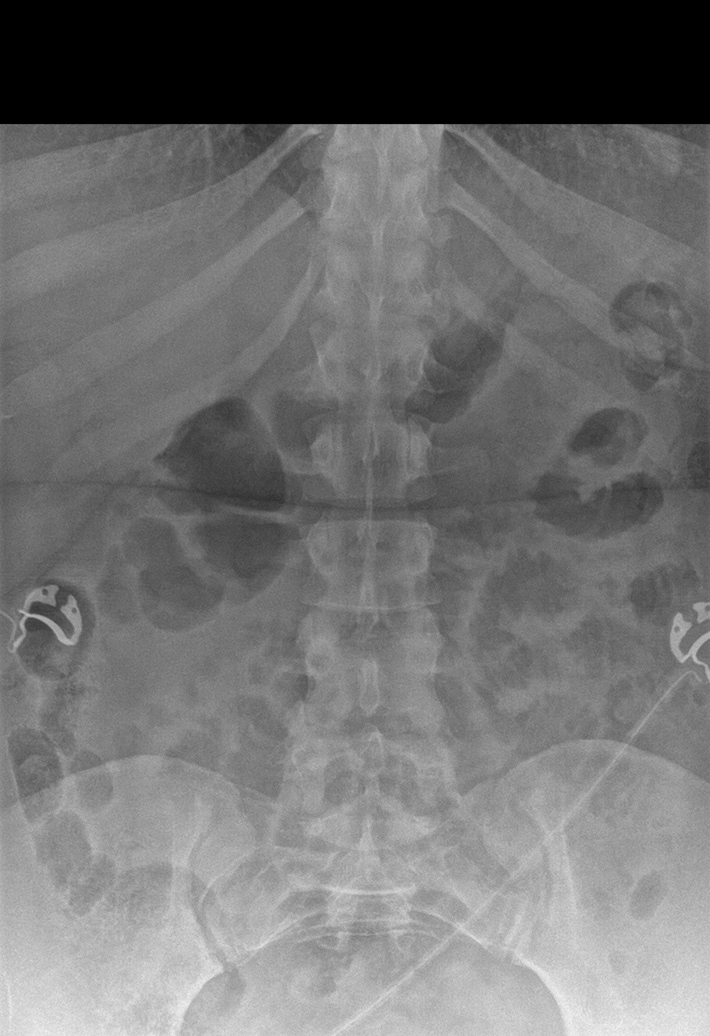

[l-spine obl (1 of 2)]
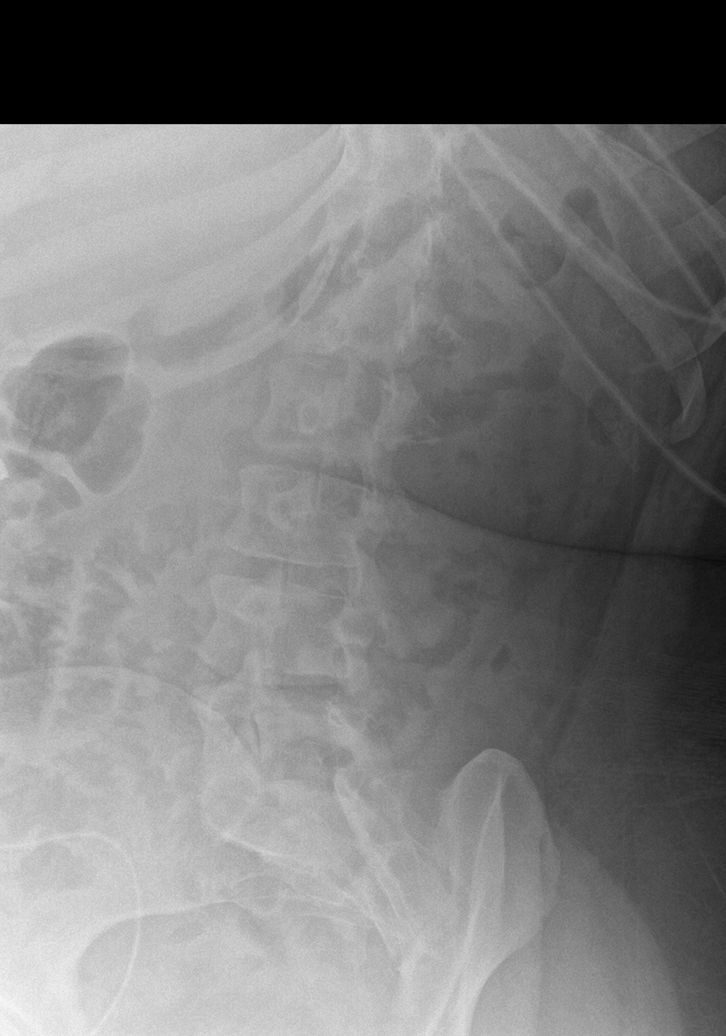

[l-spine obl (2 of 2)]
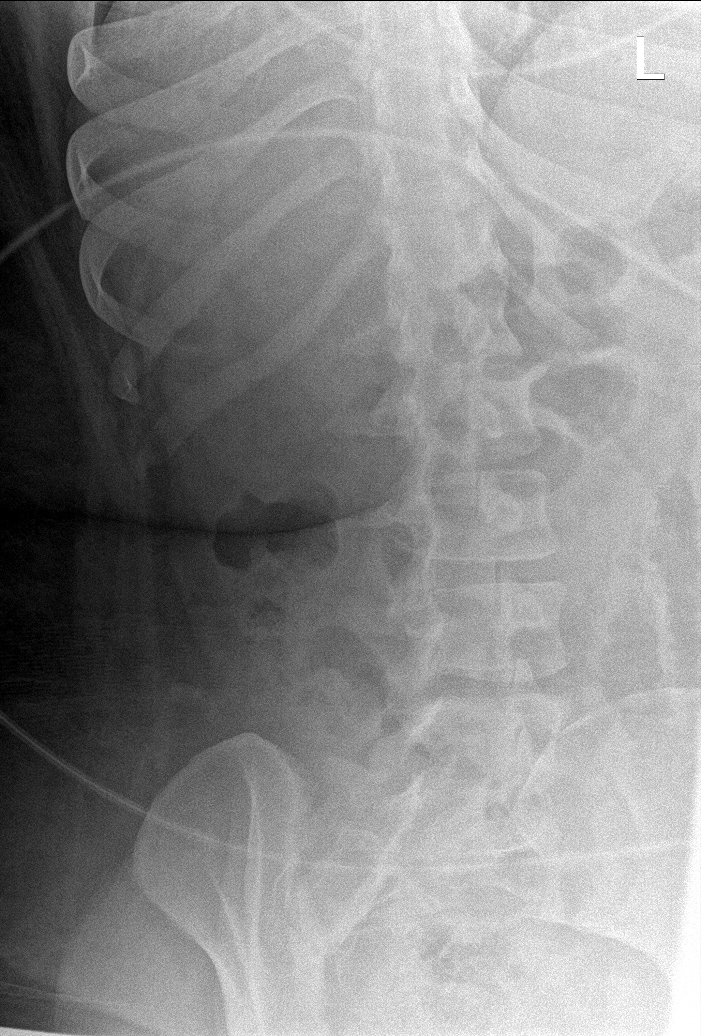

[l-spine lat]
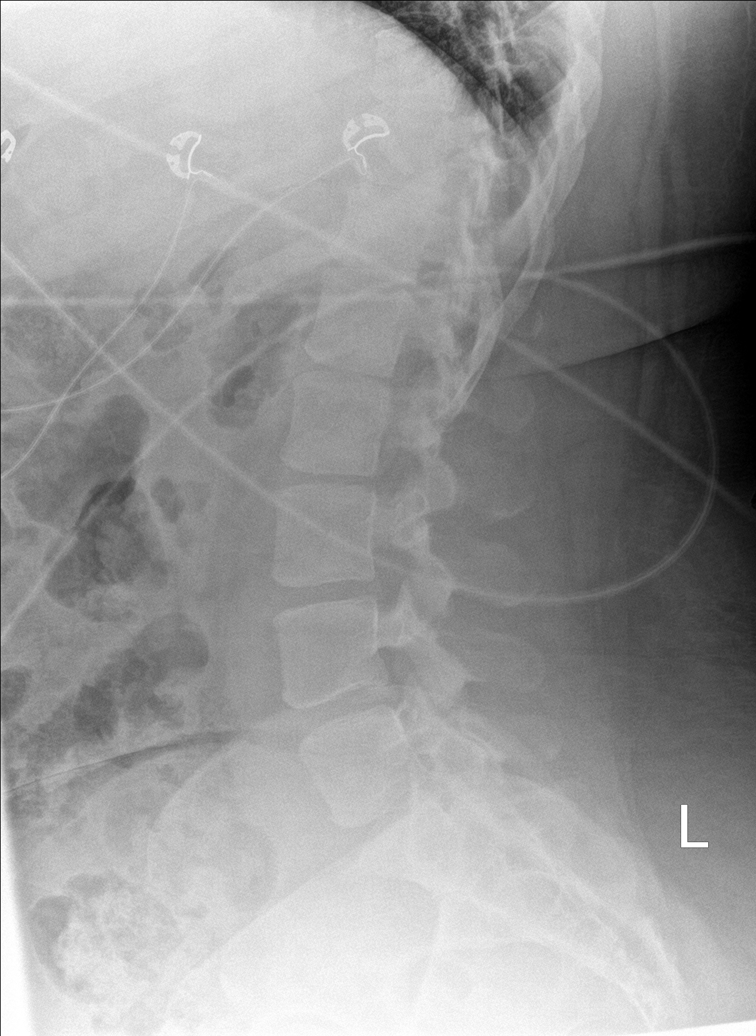

[l-spine spot]
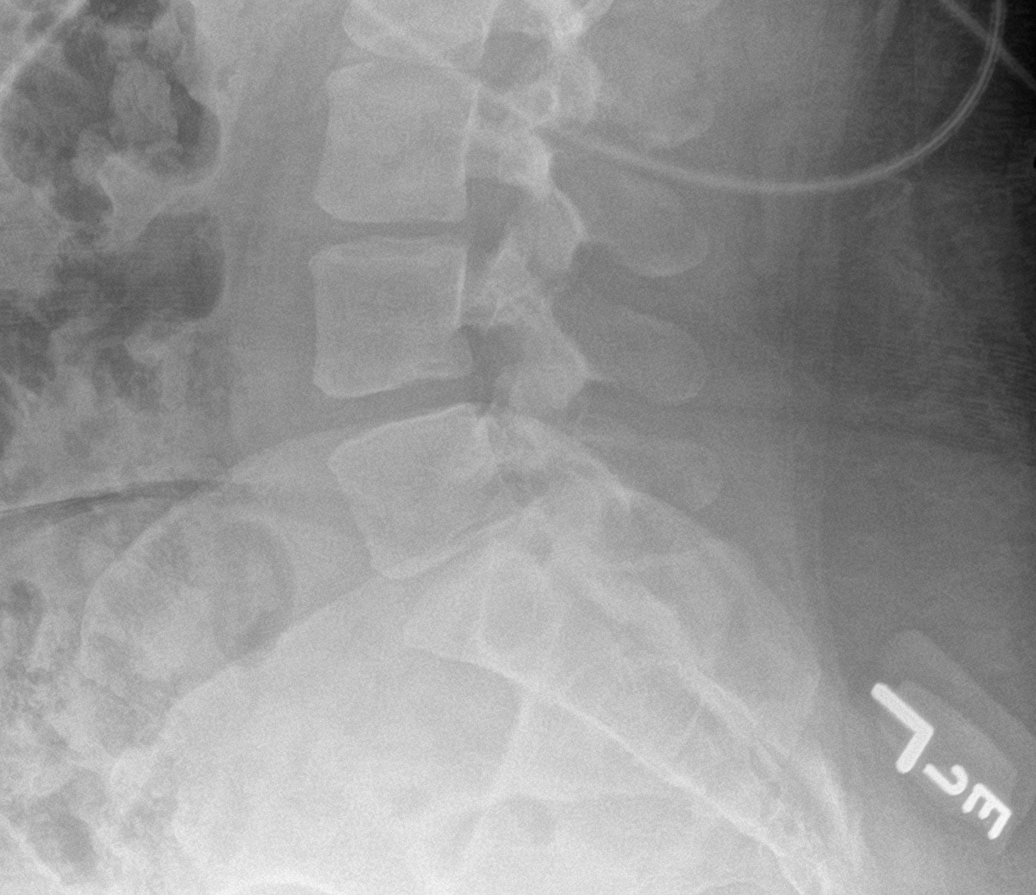

[5 of 5 positions shown; findings below may reference images not displayed]

FINDINGS: There is no evidence of lumbar spine fracture. Alignment is normal.
Intervertebral disc spaces are maintained.
IMPRESSION: Negative.

## 2022-12-23 ENCOUNTER — Encounter (HOSPITAL_BASED_OUTPATIENT_CLINIC_OR_DEPARTMENT_OTHER): Payer: Self-pay | Admitting: Emergency Medicine

## 2022-12-23 ENCOUNTER — Emergency Department (HOSPITAL_BASED_OUTPATIENT_CLINIC_OR_DEPARTMENT_OTHER)
Admission: EM | Admit: 2022-12-23 | Discharge: 2022-12-23 | Disposition: A | Discharge status: Home / Self Care | Payer: 59 | Attending: Emergency Medicine | Admitting: Emergency Medicine

## 2022-12-23 ENCOUNTER — Other Ambulatory Visit: Payer: Self-pay

## 2022-12-23 DIAGNOSIS — M545 Low back pain, unspecified: Secondary | ICD-10-CM | POA: Insufficient documentation

## 2022-12-23 LAB — URINALYSIS, MICROSCOPIC (REFLEX): RBC / HPF: >50 RBC/hpf (ref 0–5)

## 2022-12-23 LAB — URINALYSIS, ROUTINE W REFLEX MICROSCOPIC
Bilirubin Urine: NEGATIVE
Glucose, UA: NEGATIVE mg/dL
Ketones, ur: NEGATIVE mg/dL
Leukocytes,Ua: NEGATIVE
Nitrite: NEGATIVE
Protein, ur: NEGATIVE mg/dL
Specific Gravity, Urine: >=1.03 (ref 1.005–1.030)
pH: 5.5 (ref 5.0–8.0)

## 2022-12-23 LAB — PREGNANCY, URINE: Preg Test, Ur: NEGATIVE

## 2022-12-23 MED ORDER — KETOROLAC TROMETHAMINE 15 MG/ML IJ SOLN
15.0000 mg | Freq: Once | INTRAMUSCULAR | Status: AC
  Administered 2022-12-23: 15 mg via INTRAMUSCULAR
  Filled 2022-12-23: qty 1

## 2022-12-23 NOTE — ED Provider Notes (Signed)
[facility] EMERGENCY DEPARTMENT Provider Note   CSN: [id number] Arrival date & time: 12/23/22  1629     History  Chief Complaint  Patient presents with   Back Pain    Joyce Flynn is a 27 y.o. female.  26 yo F with a chief complaint of low back pain.  Been going on for about a week now.  She has had back pain like this before but never lasting this long.  She denies injury to it.  Started on a road trip.  She has pain worse with bending twisting palpation.  Denies loss of bowel or bladder denies loss of parched sensation denies numbness or weakness to the legs.  Denies abdominal pain.   Back Pain      Home Medications Prior to Admission medications   Medication Sig Start Date End Date Taking? Authorizing Provider  acyclovir (ZOVIRAX) 400 MG tablet Take 1 tablet (400 mg total) by mouth 3 (three) times daily. 01/07/19   Ripley Fraise, MD  cyclobenzaprine (FLEXERIL) 10 MG tablet Take 1 tablet (10 mg total) by mouth 2 (two) times daily as needed for muscle spasms. 05/26/22   Azucena Cecil, PA-C  dicyclomine (BENTYL) 20 MG tablet Take 1 tablet (20 mg total) by mouth 2 (two) times daily. 01/16/22   Caccavale, Sophia, PA-C  metroNIDAZOLE (FLAGYL) 500 MG tablet Take 1 tablet (500 mg total) by mouth 2 (two) times daily. One po bid x 7 days 01/07/19   Ripley Fraise, MD  ondansetron (ZOFRAN-ODT) 4 MG disintegrating tablet Take 1 tablet (4 mg total) by mouth every 8 (eight) hours as needed for nausea or vomiting. 01/16/22   Caccavale, Sophia, PA-C  valACYclovir (VALTREX) 500 MG tablet Take by mouth. 03/18/20   [provider]      Allergies    Patient has no known allergies.    Review of Systems   Review of Systems  Musculoskeletal:  Positive for back pain.    Physical Exam Updated Vital Signs BP (!) 172/99 (BP Location: Left Arm)   Pulse 83   Temp 98.2 F (36.8 C)   Resp 18   Ht 5\' 8"  (1.727 m)   Wt (!) 150.9 kg   SpO2 100%   BMI 50.59 kg/m   Physical Exam Vitals and nursing note reviewed.  Constitutional:      General: She is not in acute distress.    Appearance: She is well-developed. She is not diaphoretic.     Comments: BMi 51  HENT:     Head: Normocephalic and atraumatic.  Eyes:     Pupils: Pupils are equal, round, and reactive to light.  Cardiovascular:     Rate and Rhythm: Normal rate and regular rhythm.     Heart sounds: No murmur heard.    No friction rub. No gallop.  Pulmonary:     Effort: Pulmonary effort is normal.     Breath sounds: No wheezing or rales.  Abdominal:     General: There is no distension.     Palpations: Abdomen is soft.     Tenderness: There is no abdominal tenderness.  Musculoskeletal:        General: No tenderness.     Cervical back: Normal range of motion and neck supple.     Comments: No obvious midline spinal tenderness step-offs or deformities.  She does have pain bilaterally with straight leg raise.  Pulse motor and sensation intact distally.  Reflexes 1+ and equal bilaterally.  Skin:  General: Skin is warm and dry.  Neurological:     Mental Status: She is alert and oriented to person, place, and time.  Psychiatric:        Behavior: Behavior normal.     ED Results / Procedures / Treatments   Labs (all labs ordered are listed, but only abnormal results are displayed) Labs Reviewed  URINALYSIS, ROUTINE W REFLEX MICROSCOPIC - Abnormal; Notable for the following components:      Result Value   Hgb urine dipstick LARGE (*)    All other components within normal limits  URINALYSIS, MICROSCOPIC (REFLEX) - Abnormal; Notable for the following components:   Bacteria, UA RARE (*)    All other components within normal limits  PREGNANCY, URINE    EKG None  Radiology No results found.  Procedures Procedures    Medications Ordered in ED Medications  ketorolac (TORADOL) 15 MG/ML injection 15 mg (has no administration in time range)    ED Course/ Medical Decision Making/  A&P                           Medical Decision Making Amount and/or Complexity of Data Reviewed Labs: ordered.  Risk Prescription drug management.   27 yo F with a chief complaint of low back pain.  This been going on for about a week.  Musculoskeletal by history and physical.  Benign exam.  She had a UA obtained in triage with large blood and greater than 50 reds.  Seems unlikely to be kidney stone based on history and physical.  Will treat supportively.  PCP follow-up.  5:48 PM:  I have discussed the diagnosis/risks/treatment options with the patient and family.  Evaluation and diagnostic testing in the emergency department does not suggest an emergent condition requiring admission or immediate intervention beyond what has been performed at this time.  They will follow up with PCP. We also discussed returning to the ED immediately if new or worsening sx occur. We discussed the sx which are most concerning (e.g., sudden worsening pain, fever, inability to tolerate by mouth) that necessitate immediate return. Medications administered to the patient during their visit and any new prescriptions provided to the patient are listed below.  Medications given during this visit Medications  ketorolac (TORADOL) 15 MG/ML injection 15 mg (has no administration in time range)     The patient appears reasonably screen and/or stabilized for discharge and I doubt any other medical condition or other [facility] requiring further screening, evaluation, or treatment in the ED at this time prior to discharge.          Final Clinical Impression(s) / ED Diagnoses Final diagnoses:  Acute left-sided low back pain without sciatica    Rx / DC Orders ED Discharge Orders     None         Deno Etienne, DO 12/23/22 1748

## 2022-12-23 NOTE — Discharge Instructions (Signed)

## 2022-12-23 NOTE — ED Triage Notes (Signed)
Pt reports midline lower back pain for a week. Pain initially began on R side but now is on both side. Pain worse when sitting. Denies any injury. Feels similar to when she picked up her kids and injured her back. Denies urinary symptoms.

## 2022-12-25 ENCOUNTER — Emergency Department (HOSPITAL_BASED_OUTPATIENT_CLINIC_OR_DEPARTMENT_OTHER)
Admission: EM | Admit: 2022-12-25 | Discharge: 2022-12-25 | Disposition: A | Discharge status: Home / Self Care | Payer: No Typology Code available for payment source | Attending: Emergency Medicine | Admitting: Emergency Medicine

## 2022-12-25 ENCOUNTER — Emergency Department (HOSPITAL_BASED_OUTPATIENT_CLINIC_OR_DEPARTMENT_OTHER): Payer: No Typology Code available for payment source

## 2022-12-25 ENCOUNTER — Other Ambulatory Visit: Payer: Self-pay

## 2022-12-25 ENCOUNTER — Encounter (HOSPITAL_BASED_OUTPATIENT_CLINIC_OR_DEPARTMENT_OTHER): Payer: Self-pay | Admitting: Emergency Medicine

## 2022-12-25 DIAGNOSIS — O4592 Premature separation of placenta, unspecified, second trimester: Secondary | ICD-10-CM | POA: Diagnosis present

## 2022-12-25 DIAGNOSIS — Z87891 Personal history of nicotine dependence: Secondary | ICD-10-CM | POA: Insufficient documentation

## 2022-12-25 DIAGNOSIS — M545 Low back pain, unspecified: Secondary | ICD-10-CM | POA: Diagnosis not present

## 2022-12-25 DIAGNOSIS — R109 Unspecified abdominal pain: Secondary | ICD-10-CM | POA: Insufficient documentation

## 2022-12-25 LAB — COMPREHENSIVE METABOLIC PANEL
ALT: 24 U/L (ref 0–44)
AST: 28 U/L (ref 15–41)
Albumin: 3.4 g/dL — ABNORMAL LOW (ref 3.5–5.0)
Alkaline Phosphatase: 53 U/L (ref 38–126)
Anion gap: 6 (ref 5–15)
BUN: 12 mg/dL (ref 6–20)
CO2: 24 mmol/L (ref 22–32)
Calcium: 8 mg/dL — ABNORMAL LOW (ref 8.9–10.3)
Chloride: 106 mmol/L (ref 98–111)
Creatinine, Ser: 0.73 mg/dL (ref 0.44–1.00)
GFR, Estimated: >60 mL/min (ref >60)
Glucose, Bld: 95 mg/dL (ref 70–99)
Potassium: 4.2 mmol/L (ref 3.5–5.1)
Sodium: 136 mmol/L (ref 135–145)
Total Bilirubin: 0.3 mg/dL (ref 0.3–1.2)
Total Protein: 7.9 g/dL (ref 6.5–8.1)

## 2022-12-25 LAB — CBC WITH DIFFERENTIAL/PLATELET
Abs Immature Granulocytes: 0.01 10*3/uL (ref 0.00–0.07)
Basophils Absolute: 0 10*3/uL (ref 0.0–0.1)
Basophils Relative: 0 %
Eosinophils Absolute: 0.2 10*3/uL (ref 0.0–0.5)
Eosinophils Relative: 2 %
HCT: 39 % (ref 36.0–46.0)
Hemoglobin: 12.4 g/dL (ref 12.0–15.0)
Immature Granulocytes: 0 %
Lymphocytes Relative: 41 %
Lymphs Abs: 2.7 10*3/uL (ref 0.7–4.0)
MCH: 27.6 pg (ref 26.0–34.0)
MCHC: 31.8 g/dL (ref 30.0–36.0)
MCV: 86.9 fL (ref 80.0–100.0)
Monocytes Absolute: 0.6 10*3/uL (ref 0.1–1.0)
Monocytes Relative: 9 %
Neutro Abs: 3.1 10*3/uL (ref 1.7–7.7)
Neutrophils Relative %: 48 %
Platelets: 193 10*3/uL (ref 150–400)
RBC: 4.49 MIL/uL (ref 3.87–5.11)
RDW: 12.4 % (ref 11.5–15.5)
WBC: 6.6 10*3/uL (ref 4.0–10.5)
nRBC: 0 % (ref 0.0–0.2)

## 2022-12-25 LAB — URINALYSIS, ROUTINE W REFLEX MICROSCOPIC
Bilirubin Urine: NEGATIVE
Glucose, UA: NEGATIVE mg/dL
Ketones, ur: NEGATIVE mg/dL
Leukocytes,Ua: NEGATIVE
Nitrite: NEGATIVE
Protein, ur: NEGATIVE mg/dL
Specific Gravity, Urine: >=1.03 (ref 1.005–1.030)
pH: 6.5 (ref 5.0–8.0)

## 2022-12-25 LAB — PREGNANCY, URINE: Preg Test, Ur: NEGATIVE

## 2022-12-25 LAB — URINALYSIS, MICROSCOPIC (REFLEX)

## 2022-12-25 LAB — LIPASE, BLOOD: Lipase: 45 U/L (ref 11–51)

## 2022-12-25 MED ORDER — ACETAMINOPHEN 325 MG PO TABS
650.0000 mg | ORAL_TABLET | Freq: Once | ORAL | Status: AC
  Administered 2022-12-25: 650 mg via ORAL
  Filled 2022-12-25: qty 2

## 2022-12-25 MED ORDER — OXYCODONE-ACETAMINOPHEN 5-325 MG PO TABS
1.0000 | ORAL_TABLET | Freq: Once | ORAL | Status: AC
  Administered 2022-12-25: 1 via ORAL
  Filled 2022-12-25: qty 1

## 2022-12-25 MED ORDER — METHOCARBAMOL 500 MG PO TABS: 1000.0000 mg | ORAL_TABLET | Freq: Two times a day (BID) | ORAL | 0 refills | Status: AC

## 2022-12-25 MED ORDER — KETOROLAC TROMETHAMINE 30 MG/ML IJ SOLN
30.0000 mg | Freq: Once | INTRAMUSCULAR | Status: AC
  Administered 2022-12-25: 30 mg via INTRAVENOUS
  Filled 2022-12-25: qty 1

## 2022-12-25 MED ORDER — LIDOCAINE 5 % EX PTCH
1.0000 | MEDICATED_PATCH | Freq: Once | CUTANEOUS | Status: DC
  Administered 2022-12-25: 1 via TRANSDERMAL
  Filled 2022-12-25: qty 1

## 2022-12-25 MED ORDER — OXYCODONE HCL 5 MG PO TABS: 5.0000 mg | ORAL_TABLET | ORAL | 0 refills | Status: AC | PRN

## 2022-12-25 MED ORDER — IBUPROFEN 600 MG PO TABS: 600.0000 mg | ORAL_TABLET | Freq: Four times a day (QID) | ORAL | 0 refills | Status: AC | PRN

## 2022-12-25 MED ORDER — ACETAMINOPHEN 325 MG PO TABS: 650.0000 mg | ORAL_TABLET | Freq: Four times a day (QID) | ORAL | 0 refills | Status: AC | PRN

## 2022-12-25 NOTE — ED Triage Notes (Signed)
Pt arrives pov, slow gait, c/o lower back pain and LT flank pain. Recently seen for same. No otc meds pta. Denies fever, denies dysuria

## 2022-12-25 NOTE — ED Notes (Signed)
Patient transported to X-ray 

## 2022-12-25 NOTE — Discharge Instructions (Addendum)
Please follow-up with your Primary Care Physician within the next week. Please take your medications as instructed and discuss any changes to your medications with your primary care physician.    Please return to the Emergency Department if you have any leg numbness, leg weakness, difficulty walking, fevers, worsening of pain, lightheadedness, lose consciousness, severe abdominal pain, severe headache, difficulty urinating, or difficulty having a bowel movement.   It was a pleasure caring for you today in the emergency department.  Please return to the emergency department for any worsening or worrisome symptoms.

## 2022-12-25 NOTE — ED Provider Notes (Signed)
[facility] DEPART[facility] Provider Note  CSN: [id number] Arrival date & time: 12/25/22 0756  Chief Complaint(s) Back Pain  HPI Joyce Flynn is a 27 y.o. female with past medical history as below, significant for no sig medical hx who presents to the ED with complaint of low back pain. Seen 1/3 with similar complaint, UA with blood but w/u o/w negative. Discharged in stable condition. Returns today with similar presentation. Denies any trauma or injury, no change w/ urination. Reports the pain resolved after ED visit but returned the next morning, gradually worsening, pain now wrapping around left flank. No n/v, no fevers or chills, no ivdu or spine surgery/injections, no falls. No saddle paresthesias. No gait disturbance but has some pain with walking intermittently. Started taking aleve yesterday and her pain persisted so reports back to ed for recheck. Pain is sharp/stabbing/left low back/left flank  Past Medical History History reviewed. No pertinent past medical history. Patient Active Problem List   Diagnosis Date Noted   Placental abruption in second trimester 12/25/2022   Home Medication(s) Prior to Admission medications   Medication Sig Start Date End Date Taking? Authorizing Provider  acetaminophen (TYLENOL) 325 MG tablet Take 2 tablets (650 mg total) by mouth every 6 (six) hours as needed. 12/25/22  Yes Wynona Dove A, DO  ibuprofen (ADVIL) 600 MG tablet Take 1 tablet (600 mg total) by mouth every 6 (six) hours as needed. 12/25/22  Yes Jeanell Sparrow, DO  methocarbamol (ROBAXIN) 500 MG tablet Take 2 tablets (1,000 mg total) by mouth 2 (two) times daily for 5 days. 12/25/22 12/30/22 Yes Wynona Dove A, DO  oxyCODONE (ROXICODONE) 5 MG immediate release tablet Take 1 tablet (5 mg total) by mouth every 4 (four) hours as needed for severe pain. 12/25/22  Yes Jeanell Sparrow, DO  valACYclovir (VALTREX) 500 MG tablet Take by mouth. 03/18/20  Yes [provider]  acyclovir  (ZOVIRAX) 400 MG tablet Take 1 tablet (400 mg total) by mouth 3 (three) times daily. 01/07/19   Ripley Fraise, MD  cyclobenzaprine (FLEXERIL) 10 MG tablet Take 1 tablet (10 mg total) by mouth 2 (two) times daily as needed for muscle spasms. 05/26/22   Azucena Cecil, PA-C  dicyclomine (BENTYL) 20 MG tablet Take 1 tablet (20 mg total) by mouth 2 (two) times daily. 01/16/22   Caccavale, Sophia, PA-C  metroNIDAZOLE (FLAGYL) 500 MG tablet Take 1 tablet (500 mg total) by mouth 2 (two) times daily. One po bid x 7 days 01/07/19   Ripley Fraise, MD  ondansetron (ZOFRAN-ODT) 4 MG disintegrating tablet Take 1 tablet (4 mg total) by mouth every 8 (eight) hours as needed for nausea or vomiting. 01/16/22   Franchot Heidelberg, PA-C                                                                                                                                    Past Surgical History  History reviewed. No pertinent surgical history. Family History History reviewed. No pertinent family history.  Social History Social History   Tobacco Use   Smoking status: Former   Smokeless tobacco: Never  Scientific laboratory technician Use: Never used  Substance Use Topics   Alcohol use: Yes    Comment: social - 2 weeks ago   Drug use: Yes    Types: Marijuana    Comment: last use one week ago   Allergies Patient has no known allergies.  Review of Systems Review of Systems  Constitutional:  Negative for activity change and fever.  HENT:  Negative for facial swelling and trouble swallowing.   Eyes:  Negative for discharge and redness.  Respiratory:  Negative for cough and shortness of breath.   Cardiovascular:  Negative for chest pain and palpitations.  Gastrointestinal:  Negative for abdominal pain and nausea.  Genitourinary:  Negative for dysuria and flank pain.  Musculoskeletal:  Positive for back pain. Negative for gait problem.  Skin:  Negative for pallor and rash.  Neurological:  Negative for syncope and  headaches.    Physical Exam Vital Signs  I have reviewed the triage vital signs BP (!) 135/105   Pulse 61   Temp 98 F (36.7 C)   Resp 20   Ht 5\' 8"  (1.727 m)   Wt (!) 150.6 kg   LMP 12/21/2022 Comment: neg upreg in er today.  SpO2 100%   BMI 50.48 kg/m  Physical Exam Vitals and nursing note reviewed.  Constitutional:      General: She is not in acute distress.    Appearance: Normal appearance. She is obese.  HENT:     Head: Normocephalic and atraumatic.     Right Ear: External ear normal.     Left Ear: External ear normal.     Nose: Nose normal.     Mouth/Throat:     Mouth: Mucous membranes are moist.  Eyes:     General: No scleral icterus.       Right eye: No discharge.        Left eye: No discharge.  Cardiovascular:     Rate and Rhythm: Normal rate and regular rhythm.     Pulses: Normal pulses.     Heart sounds: Normal heart sounds.  Pulmonary:     Effort: Pulmonary effort is normal. No respiratory distress.     Breath sounds: Normal breath sounds.  Abdominal:     General: Abdomen is flat.     Palpations: Abdomen is soft.     Tenderness: There is no abdominal tenderness.  Musculoskeletal:        General: Normal range of motion.       Arms:     Cervical back: Normal range of motion.     Right lower leg: No edema.     Left lower leg: No edema.     Comments: No midline spinous process tenderness to palpation or percussion, no crepitus or step-off    LE NVI  No saddle paresthesia  Strength equal b/l le   Skin:    General: Skin is warm and dry.     Capillary Refill: Capillary refill takes less than 2 seconds.  Neurological:     Mental Status: She is alert.  Psychiatric:        Mood and Affect: Mood normal.        Behavior: Behavior normal.     ED Results and Treatments Labs (all labs ordered are listed, but  only abnormal results are displayed) Labs Reviewed  URINALYSIS, ROUTINE W REFLEX MICROSCOPIC - Abnormal; Notable for the following  components:      Result Value   Hgb urine dipstick SMALL (*)    All other components within normal limits  COMPREHENSIVE METABOLIC PANEL - Abnormal; Notable for the following components:   Calcium 8.0 (*)    Albumin 3.4 (*)    All other components within normal limits  URINALYSIS, MICROSCOPIC (REFLEX) - Abnormal; Notable for the following components:   Bacteria, UA RARE (*)    All other components within normal limits  PREGNANCY, URINE  CBC WITH DIFFERENTIAL/PLATELET  LIPASE, BLOOD                                                                                                                          Radiology CT Renal Stone Study  Result Date: 12/25/2022 CLINICAL DATA:  Left flank pain for 1 week EXAM: CT ABDOMEN AND PELVIS WITHOUT CONTRAST TECHNIQUE: Multidetector CT imaging of the abdomen and pelvis was performed following the standard protocol without IV contrast. RADIATION DOSE REDUCTION: This exam was performed according to the departmental dose-optimization program which includes automated exposure control, adjustment of the mA and/or kV according to patient size and/or use of iterative reconstruction technique. COMPARISON:  Lumbar radiographs 12/25/2022 FINDINGS: Body habitus reduces diagnostic sensitivity and specificity. Lower chest: Unremarkable Hepatobiliary: Unremarkable Pancreas: Unremarkable Spleen: Unremarkable Adrenals/Urinary Tract: Unremarkable. No hydronephrosis or urinary tract calculi observed. Stomach/Bowel: Unremarkable.  Normal appendix. Vascular/Lymphatic: Unremarkable Reproductive: Unremarkable Other: No supplemental non-categorized findings. Musculoskeletal: Central disc osteophyte complex at L5-S1 without overt impingement. Loss of disc height and mild vacuum disc phenomenon with 2-3 mm degenerative retrolisthesis at L5-S1. IMPRESSION: 1. A specific cause for the patient's left flank pain is not identified. 2. Central disc osteophyte complex at L5-S1 without overt  impingement. Electronically Signed   By: Gaylyn Rong M.D.   On: 12/25/2022 10:25   DG Lumbar Spine Complete  Result Date: 12/25/2022 CLINICAL DATA:  Lower back pain. Tailbone pain and left-sided lower back pain for 1 week. EXAM: LUMBAR SPINE - COMPLETE 4+ VIEW COMPARISON:  Lumbar spine radiographs 04/30/2022 FINDINGS: There are 5 non-rib-bearing lumbar-type vertebral bodies. There again appears to be minimal 3 mm retrolisthesis of L5 on S1. Normal frontal alignment. No pars defect is seen. Mild posterior L5-S1 disc space narrowing. New 8 mm density overlying stool within the transverse colon may represent dense stool. A left renal stone is considered, however this was not seen on 04/30/2022, there is a similar likely focus of dense stool seen overlying the ascending colon and right ilium on the current study. IMPRESSION: 1. Mild posterior L5-S1 disc space narrowing. 2. Minimal 3 mm retrolisthesis of L5 on S1. Electronically Signed   By: Neita Garnet M.D.   On: 12/25/2022 09:41    Pertinent labs & imaging results that were available during my care of the patient were reviewed by me and considered in my medical decision  making (see MDM for details).  Medications Ordered in ED Medications  lidocaine (LIDODERM) 5 % 1 patch (1 patch Transdermal Patch Applied 12/25/22 1026)  ketorolac (TORADOL) 30 MG/ML injection 30 mg (30 mg Intravenous Given 12/25/22 0909)  acetaminophen (TYLENOL) tablet 650 mg (650 mg Oral Given 12/25/22 1026)  oxyCODONE-acetaminophen (PERCOCET/ROXICET) 5-325 MG per tablet 1 tablet (1 tablet Oral Given 12/25/22 1109)                                                                                                                                     Procedures Procedures  (including critical care time)  Medical Decision Making / ED Course   MDM:  Olinda Diane is a 27 y.o. female with past medical history as below, significant for no sig medical hx who presents to the ED with  complaint of low back pain. . The complaint involves an extensive differential diagnosis and also carries with it a high risk of complications and morbidity.  Serious etiology was considered. Ddx includes but is not limited to: Differential diagnosis includes but is not exclusive to musculoskeletal back pain, renal colic, urinary tract infection, pyelonephritis, intra-abdominal causes of back pain, aortic aneurysm or dissection, cauda equina syndrome, sciatica, lumbar disc disease, thoracic disc disease, etc.  Given pain is now occurring to left flank will get screening labs, eval for intra-abdominal pathology  On initial assessment the patient is: resting comfortably, NAD, neuro non-focal. Get labs/ua/preg, give meds, get xr and re-check Vital signs and nursing notes were reviewed    Labs reviewed and are stable XR stable but concern for possible kidney stone CT renal does not show any nephrolithiasis >  " 2. Central disc osteophyte complex at L5-S1 without overt  impingement.  " >> pain may be 2/2 osteophyte complex, advised her to f/u with pcp; may need spine surgery eval in the future   Pt feeling much better on recheck, neuro intact, ambulatory.   F/u with pcp   Patient presents with low back pain without signs of spinal cord compression, cauda equina syndrome, infection, aneurysm, or other serious etiology. The patient is neurologically intact. Given the extremely low risk of these diagnoses further testing and evaluation for these possibilities does not appear to be indicated at this time. Detailed discussions were had with the patient and/or family and caregivers, regarding current findings, and need for close f/u with PCP or on call doctor. The patient has been instructed to return immediately if the symptoms worsen in any way. Patient verbalized understanding and is in agreement with current care plan. All questions answered prior to discharge.    Additional history  obtained: -Additional history obtained from na -External records from outside source obtained and reviewed including: Chart review including previous notes, labs, imaging, consultation notes including prior ed visits, prior labs/medications/imaging    Lab Tests: -I ordered, reviewed, and interpreted labs.   The pertinent results include:  Labs Reviewed  URINALYSIS, ROUTINE W REFLEX MICROSCOPIC - Abnormal; Notable for the following components:      Result Value   Hgb urine dipstick SMALL (*)    All other components within normal limits  COMPREHENSIVE METABOLIC PANEL - Abnormal; Notable for the following components:   Calcium 8.0 (*)    Albumin 3.4 (*)    All other components within normal limits  URINALYSIS, MICROSCOPIC (REFLEX) - Abnormal; Notable for the following components:   Bacteria, UA RARE (*)    All other components within normal limits  PREGNANCY, URINE  CBC WITH DIFFERENTIAL/PLATELET  LIPASE, BLOOD    Notable for labs stable  EKG   EKG Interpretation  Date/Time:    Ventricular Rate:    PR Interval:    QRS Duration:   QT Interval:    QTC Calculation:   R Axis:     Text Interpretation:           Imaging Studies ordered: I ordered imaging studies including lumbar xr, ct renal I independently visualized the following imaging with scope of interpretation limited to determining acute life threatening conditions related to emergency care: xr ct renal, which revealed as above I independently visualized and interpreted imaging. I agree with the radiologist interpretation   Medicines ordered and prescription drug management: Meds ordered this encounter  Medications   ketorolac (TORADOL) 30 MG/ML injection 30 mg   lidocaine (LIDODERM) 5 % 1 patch   acetaminophen (TYLENOL) tablet 650 mg   oxyCODONE-acetaminophen (PERCOCET/ROXICET) 5-325 MG per tablet 1 tablet   oxyCODONE (ROXICODONE) 5 MG immediate release tablet    Sig: Take 1 tablet (5 mg total) by mouth  every 4 (four) hours as needed for severe pain.    Dispense:  10 tablet    Refill:  0   methocarbamol (ROBAXIN) 500 MG tablet    Sig: Take 2 tablets (1,000 mg total) by mouth 2 (two) times daily for 5 days.    Dispense:  20 tablet    Refill:  0   acetaminophen (TYLENOL) 325 MG tablet    Sig: Take 2 tablets (650 mg total) by mouth every 6 (six) hours as needed.    Dispense:  36 tablet    Refill:  0   ibuprofen (ADVIL) 600 MG tablet    Sig: Take 1 tablet (600 mg total) by mouth every 6 (six) hours as needed.    Dispense:  30 tablet    Refill:  0    -I have reviewed the patients home medicines and have made adjustments as needed   Consultations Obtained: I requested consultation with the na,  and discussed lab and imaging findings as well as pertinent plan - they recommend: na   Cardiac Monitoring: The patient was maintained on a cardiac monitor.  I personally viewed and interpreted the cardiac monitored which showed an underlying rhythm of: na  Social Determinants of Health:  Diagnosis or treatment significantly limited by social determinants of health: obesity   Reevaluation: After the interventions noted above, I reevaluated the patient and found that they have improved  Co morbidities that complicate the patient evaluation History reviewed. No pertinent past medical history.    Dispostion: Disposition decision including need for hospitalization was considered, and patient discharged from emergency department.    Final Clinical Impression(s) / ED Diagnoses Final diagnoses:  Acute low back pain without sciatica, unspecified back pain laterality     This chart was dictated using voice recognition software.  Despite best efforts to  proofread,  errors can occur which can change the documentation meaning.    Jeanell Sparrow, DO 12/25/22 1157

## 2023-10-22 ENCOUNTER — Ambulatory Visit
Admission: EM | Admit: 2023-10-22 | Discharge: 2023-10-22 | Disposition: A | Discharge status: Home / Self Care | Payer: No Typology Code available for payment source | Attending: Family Medicine | Admitting: Family Medicine

## 2023-10-22 DIAGNOSIS — J0140 Acute pansinusitis, unspecified: Secondary | ICD-10-CM | POA: Diagnosis not present

## 2023-10-22 MED ORDER — AZITHROMYCIN 250 MG PO TABS: ORAL_TABLET | ORAL | 0 refills | Status: AC

## 2023-10-22 MED ORDER — CETIRIZINE HCL 10 MG PO TABS: 10.0000 mg | ORAL_TABLET | Freq: Every day | ORAL | 0 refills | Status: AC

## 2023-10-22 NOTE — ED Triage Notes (Signed)
Pt present with c/o blood in nasal mucus X 6 days.   Pt states she also has a headache that started today. Home interventions: Allergy medicine at home.

## 2023-10-22 NOTE — ED Provider Notes (Signed)
[facility]    CSN: [id number] Arrival date & time: 10/22/23  1620      History   Chief Complaint Chief Complaint  Patient presents with   Nasal Congestion    HPI Joyce Flynn is a 27 y.o. female.   HPI SINUSITIS Patient presents with concern for possible sinus infection. Onset: 3 weeks ago. Has had bloody discharge with mucus x 6 days. Patient denies history of  recurrent sinus problems or seasonal allergies. Endorse facial pressure, pressure at the maxillary sinus region frontal headache, today, copious mucus. Denies fever, ST, chest tightness, eye irritation,  or GI symptoms. Attempted relief with OTC medication without any relief of symptoms.  History reviewed. No pertinent past medical history.  Patient Active Problem List   Diagnosis Date Noted   Placental abruption in second trimester 12/25/2022    History reviewed. No pertinent surgical history.  OB History   No obstetric history on file.      Home Medications    Prior to Admission medications   Medication Sig Start Date End Date Taking? Authorizing Provider  azithromycin (ZITHROMAX) 250 MG tablet Take 2 tabs PO x 1 dose, then 1 tab PO QD x 4 days 10/22/23  Yes Bing Neighbors, NP  cetirizine (ZYRTEC) 10 MG tablet Take 1 tablet (10 mg total) by mouth at bedtime. 10/22/23  Yes Bing Neighbors, NP  acetaminophen (TYLENOL) 325 MG tablet Take 2 tablets (650 mg total) by mouth every 6 (six) hours as needed. 12/25/22   Sloan Leiter, DO  acyclovir (ZOVIRAX) 400 MG tablet Take 1 tablet (400 mg total) by mouth 3 (three) times daily. 01/07/19   Zadie Rhine, MD  cyclobenzaprine (FLEXERIL) 10 MG tablet Take 1 tablet (10 mg total) by mouth 2 (two) times daily as needed for muscle spasms. 05/26/22   Al Decant, PA-C  dicyclomine (BENTYL) 20 MG tablet Take 1 tablet (20 mg total) by mouth 2 (two) times daily. 01/16/22   Caccavale, Sophia, PA-C  ibuprofen (ADVIL) 600 MG tablet Take 1 tablet (600 mg  total) by mouth every 6 (six) hours as needed. 12/25/22   Sloan Leiter, DO  metroNIDAZOLE (FLAGYL) 500 MG tablet Take 1 tablet (500 mg total) by mouth 2 (two) times daily. One po bid x 7 days 01/07/19   Zadie Rhine, MD  ondansetron (ZOFRAN-ODT) 4 MG disintegrating tablet Take 1 tablet (4 mg total) by mouth every 8 (eight) hours as needed for nausea or vomiting. 01/16/22   Caccavale, Sophia, PA-C  oxyCODONE (ROXICODONE) 5 MG immediate release tablet Take 1 tablet (5 mg total) by mouth every 4 (four) hours as needed for severe pain. 12/25/22   Sloan Leiter, DO  valACYclovir (VALTREX) 500 MG tablet Take by mouth. 03/18/20   [provider]    Family History History reviewed. No pertinent family history.  Social History Social History   Tobacco Use   Smoking status: Former   Smokeless tobacco: Never  Advertising account planner   Vaping status: Never Used  Substance Use Topics   Alcohol use: Yes    Comment: social - 2 weeks ago   Drug use: Yes    Types: Marijuana    Comment: last use one week ago     Allergies   Patient has no known allergies.   Review of Systems Review of Systems Pertinent negatives listed in HPI   Physical Exam Triage Vital Signs ED Triage Vitals  Encounter Vitals Group     BP 10/22/23  1658 136/82     Systolic BP Percentile --      Diastolic BP Percentile --      Pulse Rate 10/22/23 1658 76     Resp 10/22/23 1658 17     Temp 10/22/23 1658 98.6 F (37 C)     Temp Source 10/22/23 1658 Oral     SpO2 10/22/23 1658 98 %     Weight --      Height --      Head Circumference --      Peak Flow --      Pain Score 10/22/23 1657 0     Pain Loc --      Pain Education --      Exclude from Growth Chart --    No data found.  Updated Vital Signs BP 136/82 (BP Location: Right Arm)   Pulse 76   Temp 98.6 F (37 C) (Oral)   Resp 17   SpO2 98%   Visual Acuity Right Eye Distance:   Left Eye Distance:   Bilateral Distance:    Right Eye Near:   Left Eye Near:     Bilateral Near:     Physical Exam General Appearance:    Alert, cooperative, no distress  HENT:   Normocephalic, TM normal, negative palpable neck nodes, throat without tonsil enlargement without erythema  post nasal drip noted, nasal mucosa congested,   Eyes:    PERRL, conjunctiva/corneas clear, EOM's intact       Lungs:     Clear to auscultation bilaterally, respirations unlabored  Heart:    Regular rate and rhythm  Neurologic:   Awake, alert, oriented x 3. No apparent focal neurological           defect.         UC Treatments / Results  Labs (all labs ordered are listed, but only abnormal results are displayed) Labs Reviewed - No data to display  EKG   Radiology No results found.  Procedures Procedures (including critical care time)  Medications Ordered in UC Medications - No data to display  Initial Impression / Assessment and Plan / UC Course  I have reviewed the triage vital signs and the nursing notes.  Pertinent labs & imaging results that were available during my care of the patient were reviewed by me and considered in my medical decision making (see chart for details).   Acute sinusitis, uncomplicated. Empiric treatment with Azithromycin. Continue otc antihistamine treatment while taking medication. Force fluids. Return if symptoms worsen or do not improve.Patient verbalized understanding and agreement with plan. Final Clinical Impressions(s) / UC Diagnoses   Final diagnoses:  Acute non-recurrent pansinusitis     Discharge Instructions      Take medication as prescribed.  Your symptoms should improve within 5 to 7 days although you may continue to have some residual nasal drainage start taking the Zyrtec and take daily until nasal drainage resolves.  Return as needed.   ED Prescriptions     Medication Sig Dispense Auth. Provider   azithromycin (ZITHROMAX) 250 MG tablet Take 2 tabs PO x 1 dose, then 1 tab PO QD x 4 days 6 tablet Bing Neighbors, NP    cetirizine (ZYRTEC) 10 MG tablet Take 1 tablet (10 mg total) by mouth at bedtime. 30 tablet Bing Neighbors, NP      PDMP not reviewed this encounter.   Bing Neighbors, NP 10/23/23 2145

## 2023-10-22 NOTE — Discharge Instructions (Signed)
Take medication as prescribed.  Your symptoms should improve within 5 to 7 days although you may continue to have some residual nasal drainage start taking the Zyrtec and take daily until nasal drainage resolves.  Return as needed.

## 2024-04-26 ENCOUNTER — Ambulatory Visit
Admission: EM | Admit: 2024-04-26 | Discharge: 2024-04-26 | Disposition: A | Discharge status: Home / Self Care | Attending: Family Medicine | Admitting: Family Medicine

## 2024-04-26 DIAGNOSIS — R197 Diarrhea, unspecified: Secondary | ICD-10-CM | POA: Diagnosis not present

## 2024-04-26 LAB — POCT URINALYSIS DIP (MANUAL ENTRY)
Bilirubin, UA: NEGATIVE
Blood, UA: NEGATIVE
Glucose, UA: NEGATIVE mg/dL
Ketones, POC UA: NEGATIVE mg/dL
Leukocytes, UA: NEGATIVE
Nitrite, UA: NEGATIVE
Protein Ur, POC: 30 mg/dL — AB
Spec Grav, UA: 1.025 (ref 1.010–1.025)
Urobilinogen, UA: 1 U/dL
pH, UA: 5.5 (ref 5.0–8.0)

## 2024-04-26 LAB — POCT URINE PREGNANCY: Preg Test, Ur: NEGATIVE

## 2024-04-26 NOTE — ED Triage Notes (Signed)
 Pt present with c/o food poisoning x Saturday. States she had an egg roll, ribs and chicken tenders.  Reports stomach pain and diarrhea, denies nausea and vomiting.

## 2024-04-26 NOTE — ED Provider Notes (Signed)
 [facility]    CSN: [id number] Arrival date & time: 04/26/24  1523      History   Chief Complaint Chief Complaint  Patient presents with   Abdominal Pain    HPI Joyce Flynn is a 28 y.o. female presents for diarrhea and nausea.  Patient reports 5 days ago she went to a restaurant where she had egg rolls, ribs and chicken tenders.  States the next morning she woke up with nausea and diarrhea which has persisted since that time.  Reports multiple episodes of liquidy nonbloody diarrhea, anywhere from 5-6 episodes a day.  She has nausea but denies any vomiting.  No fevers or chills.  Some abdominal cramping but otherwise no abdominal pain.  No recent travel.  No known sick contacts.  She is able to drink fluids normally denies dysuria.  No history of GI diagnoses such as Crohn's, IBS, colitis, diverticulitis.  No abdominal surgeries.  She has not taken any OTC medications for her symptoms.  No other concerns at this time.   Abdominal Pain Associated symptoms: diarrhea and nausea     History reviewed. No pertinent past medical history.  Patient Active Problem List   Diagnosis Date Noted   Placental abruption in second trimester 12/25/2022    History reviewed. No pertinent surgical history.  OB History   No obstetric history on file.      Home Medications    Prior to Admission medications   Medication Sig Start Date End Date Taking? Authorizing Provider  acetaminophen  (TYLENOL ) 325 MG tablet Take 2 tablets (650 mg total) by mouth every 6 (six) hours as needed. 12/25/22   Teddi Favors, DO  acyclovir  (ZOVIRAX ) 400 MG tablet Take 1 tablet (400 mg total) by mouth 3 (three) times daily. 01/07/19   Eldon Greenland, MD  azithromycin  (ZITHROMAX ) 250 MG tablet Take 2 tabs PO x 1 dose, then 1 tab PO QD x 4 days 10/22/23   Buena Carmine, NP  cetirizine  (ZYRTEC ) 10 MG tablet Take 1 tablet (10 mg total) by mouth at bedtime. 10/22/23   Buena Carmine, NP  cyclobenzaprine   (FLEXERIL ) 10 MG tablet Take 1 tablet (10 mg total) by mouth 2 (two) times daily as needed for muscle spasms. 05/26/22   Adel Aden, PA-C  dicyclomine  (BENTYL ) 20 MG tablet Take 1 tablet (20 mg total) by mouth 2 (two) times daily. 01/16/22   Caccavale, Sophia, PA-C  ibuprofen  (ADVIL ) 600 MG tablet Take 1 tablet (600 mg total) by mouth every 6 (six) hours as needed. 12/25/22   Teddi Favors, DO  metroNIDAZOLE  (FLAGYL ) 500 MG tablet Take 1 tablet (500 mg total) by mouth 2 (two) times daily. One po bid x 7 days 01/07/19   Eldon Greenland, MD  ondansetron  (ZOFRAN -ODT) 4 MG disintegrating tablet Take 1 tablet (4 mg total) by mouth every 8 (eight) hours as needed for nausea or vomiting. 01/16/22   Caccavale, Sophia, PA-C  oxyCODONE  (ROXICODONE ) 5 MG immediate release tablet Take 1 tablet (5 mg total) by mouth every 4 (four) hours as needed for severe pain. 12/25/22   Teddi Favors, DO  valACYclovir (VALTREX) 500 MG tablet Take by mouth. 03/18/20   [provider]    Family History History reviewed. No pertinent family history.  Social History Social History   Tobacco Use   Smoking status: Former   Smokeless tobacco: Never  Vaping Use   Vaping status: Never Used  Substance Use Topics   Alcohol use: Yes  Comment: social - 2 weeks ago   Drug use: Yes    Types: Marijuana    Comment: last use one week ago     Allergies   Patient has no known allergies.   Review of Systems Review of Systems  Gastrointestinal:  Positive for abdominal pain, diarrhea and nausea.     Physical Exam Triage Vital Signs ED Triage Vitals [04/26/24 1639]  Encounter Vitals Group     BP (!) 146/92     Systolic BP Percentile      Diastolic BP Percentile      Pulse Rate 87     Resp 18     Temp 98.8 F (37.1 C)     Temp Source Oral     SpO2 97 %     Weight      Height      Head Circumference      Peak Flow      Pain Score 5     Pain Loc      Pain Education      Exclude from Growth Chart     No data found.  Updated Vital Signs BP (!) 146/92 (BP Location: Right Arm)   Pulse 87   Temp 98.8 F (37.1 C) (Oral)   Resp 18   LMP 04/10/2024 (Exact Date)   SpO2 97%   Visual Acuity Right Eye Distance:   Left Eye Distance:   Bilateral Distance:    Right Eye Near:   Left Eye Near:    Bilateral Near:     Physical Exam Vitals and nursing note reviewed.  Constitutional:      General: She is not in acute distress.    Appearance: Normal appearance. She is not ill-appearing.  HENT:     Head: Normocephalic and atraumatic.  Eyes:     Pupils: Pupils are equal, round, and reactive to light.  Cardiovascular:     Rate and Rhythm: Normal rate.  Pulmonary:     Effort: Pulmonary effort is normal.  Abdominal:     General: Bowel sounds are increased.     Palpations: Abdomen is soft. There is no hepatomegaly or splenomegaly.     Tenderness: There is generalized abdominal tenderness. There is no right CVA tenderness, left CVA tenderness, guarding or rebound. Negative signs include Rovsing's sign and McBurney's sign.     Comments: Abdomen is obese  Skin:    General: Skin is warm and dry.  Neurological:     General: No focal deficit present.     Mental Status: She is alert and oriented to person, place, and time.  Psychiatric:        Mood and Affect: Mood normal.        Behavior: Behavior normal.      UC Treatments / Results  Labs (all labs ordered are listed, but only abnormal results are displayed) Labs Reviewed  POCT URINALYSIS DIP (MANUAL ENTRY) - Abnormal; Notable for the following components:      Result Value   Protein Ur, POC =30 (*)    All other components within normal limits  C DIFFICILE QUICK SCREEN W PCR REFLEX    GI PROFILE, STOOL, PCR  BASIC METABOLIC PANEL WITH GFR  CBC WITH DIFFERENTIAL/PLATELET  POCT URINE PREGNANCY    EKG   Radiology No results found.  Procedures Procedures (including critical care time)  Medications Ordered in  UC Medications - No data to display  Initial Impression / Assessment and Plan / UC Course  I  have reviewed the triage vital signs and the nursing notes.  Pertinent labs & imaging results that were available during my care of the patient were reviewed by me and considered in my medical decision making (see chart for details).     Reviewed exam and symptoms with patient.  No red flags.  Vital signs stable.  UA and hCG negative.  Patient with persistent liquid diarrhea x 5 days.  Will do stool cultures as well as check BMP and CBC.  Advised fluids/electrolyte replacement with Gatorade, Powerade, Pedialyte, water.  May take Imodium OTC as needed.  Instructed to follow-up with PCP in 2 days for recheck.  Strict ER precautions reviewed and patient verbalized understanding. Final Clinical Impressions(s) / UC Diagnoses   Final diagnoses:  Diarrhea, unspecified type     Discharge Instructions      Please bring a stool sample back to the clinic once you have 1 so that we can send to the lab to check for any infectious causes of your symptoms.  The clinical contact you with these results as well as the results of the blood work done once available.  Continue fluids and electrolyte replacement with Gatorade, Powerade aid, Pedialyte, water.  Bland diet and advance as you tolerate.  You may take over-the-counter Imodium as needed.  Please follow-up with your PCP in 2 days for recheck.  Please go to the ER for any worsening symptoms.  Hope you feel better soon!     ED Prescriptions   None    PDMP not reviewed this encounter.   Alleen Arbour, NP 04/26/24 [id number]

## 2024-04-26 NOTE — Discharge Instructions (Signed)
 Please bring a stool sample back to the clinic once you have 1 so that we can send to the lab to check for any infectious causes of your symptoms.  The clinical contact you with these results as well as the results of the blood work done once available.  Continue fluids and electrolyte replacement with Gatorade, Powerade aid, Pedialyte, water.  Bland diet and advance as you tolerate.  You may take over-the-counter Imodium as needed.  Please follow-up with your PCP in 2 days for recheck.  Please go to the ER for any worsening symptoms.  Hope you feel better soon!

## 2024-04-27 ENCOUNTER — Telehealth: Payer: Self-pay | Admitting: Nurse Practitioner

## 2024-04-27 ENCOUNTER — Other Ambulatory Visit (HOSPITAL_COMMUNITY)
Admission: RE | Admit: 2024-04-27 | Discharge: 2024-04-27 | Disposition: A | Discharge status: Home / Self Care | Attending: Nurse Practitioner | Admitting: Nurse Practitioner

## 2024-04-27 DIAGNOSIS — R197 Diarrhea, unspecified: Secondary | ICD-10-CM

## 2024-04-27 LAB — BASIC METABOLIC PANEL WITH GFR
BUN/Creatinine Ratio: 8 — ABNORMAL LOW (ref 9–23)
BUN: 7 mg/dL (ref 6–20)
CO2: 22 mmol/L (ref 20–29)
Calcium: 8.9 mg/dL (ref 8.7–10.2)
Chloride: 103 mmol/L (ref 96–106)
Creatinine, Ser: 0.85 mg/dL (ref 0.57–1.00)
Glucose: 67 mg/dL — ABNORMAL LOW (ref 70–99)
Potassium: 3.9 mmol/L (ref 3.5–5.2)
Sodium: 139 mmol/L (ref 134–144)
eGFR: 97 mL/min/{1.73_m2} (ref >59)

## 2024-04-27 LAB — CBC WITH DIFFERENTIAL/PLATELET
Basophils Absolute: 0 10*3/uL (ref 0.0–0.2)
Basos: 0 %
EOS (ABSOLUTE): 0.2 10*3/uL (ref 0.0–0.4)
Eos: 2 %
Hematocrit: 40.3 % (ref 34.0–46.6)
Hemoglobin: 12.6 g/dL (ref 11.1–15.9)
Immature Grans (Abs): 0 10*3/uL (ref 0.0–0.1)
Immature Granulocytes: 0 %
Lymphocytes Absolute: 2.8 10*3/uL (ref 0.7–3.1)
Lymphs: 36 %
MCH: 25.4 pg — ABNORMAL LOW (ref 26.6–33.0)
MCHC: 31.3 g/dL — ABNORMAL LOW (ref 31.5–35.7)
MCV: 81 fL (ref 79–97)
Monocytes Absolute: 0.9 10*3/uL (ref 0.1–0.9)
Monocytes: 11 %
Neutrophils Absolute: 3.9 10*3/uL (ref 1.4–7.0)
Neutrophils: 51 %
Platelets: 221 10*3/uL (ref 150–450)
RBC: 4.96 x10E6/uL (ref 3.77–5.28)
RDW: 13 % (ref 11.7–15.4)
WBC: 7.7 10*3/uL (ref 3.4–10.8)

## 2024-04-27 NOTE — Telephone Encounter (Signed)
Order changed/corrected

## 2024-04-28 LAB — GASTROINTESTINAL PANEL BY PCR, STOOL (REPLACES STOOL CULTURE)

## 2024-04-28 LAB — C DIFFICILE QUICK SCREEN W PCR REFLEX
C Diff antigen: NEGATIVE
C Diff interpretation: NOT DETECTED
C Diff toxin: NEGATIVE

## 2024-05-11 ENCOUNTER — Other Ambulatory Visit: Payer: Self-pay

## 2024-05-11 ENCOUNTER — Encounter (HOSPITAL_BASED_OUTPATIENT_CLINIC_OR_DEPARTMENT_OTHER): Payer: Self-pay | Admitting: Emergency Medicine

## 2024-05-11 ENCOUNTER — Emergency Department (HOSPITAL_BASED_OUTPATIENT_CLINIC_OR_DEPARTMENT_OTHER)
Admission: EM | Admit: 2024-05-11 | Discharge: 2024-05-11 | Disposition: A | Discharge status: Home / Self Care | Attending: Emergency Medicine | Admitting: Emergency Medicine

## 2024-05-11 DIAGNOSIS — R197 Diarrhea, unspecified: Secondary | ICD-10-CM | POA: Diagnosis present

## 2024-05-11 LAB — COMPREHENSIVE METABOLIC PANEL WITH GFR
ALT: 22 U/L (ref 0–44)
AST: 27 U/L (ref 15–41)
Albumin: 4.2 g/dL (ref 3.5–5.0)
Alkaline Phosphatase: 64 U/L (ref 38–126)
Anion gap: 14 (ref 5–15)
BUN: 9 mg/dL (ref 6–20)
CO2: 20 mmol/L — ABNORMAL LOW (ref 22–32)
Calcium: 8.9 mg/dL (ref 8.9–10.3)
Chloride: 103 mmol/L (ref 98–111)
Creatinine, Ser: 0.72 mg/dL (ref 0.44–1.00)
GFR, Estimated: >60 mL/min (ref >60)
Glucose, Bld: 79 mg/dL (ref 70–99)
Potassium: 3.7 mmol/L (ref 3.5–5.1)
Sodium: 136 mmol/L (ref 135–145)
Total Bilirubin: 0.3 mg/dL (ref 0.0–1.2)
Total Protein: 8.4 g/dL — ABNORMAL HIGH (ref 6.5–8.1)

## 2024-05-11 LAB — CBC
HCT: 37.9 % (ref 36.0–46.0)
Hemoglobin: 12.2 g/dL (ref 12.0–15.0)
MCH: 26.1 pg (ref 26.0–34.0)
MCHC: 32.2 g/dL (ref 30.0–36.0)
MCV: 81 fL (ref 80.0–100.0)
Platelets: 237 10*3/uL (ref 150–400)
RBC: 4.68 MIL/uL (ref 3.87–5.11)
RDW: 14 % (ref 11.5–15.5)
WBC: 7.9 10*3/uL (ref 4.0–10.5)
nRBC: 0 % (ref 0.0–0.2)

## 2024-05-11 LAB — URINALYSIS, ROUTINE W REFLEX MICROSCOPIC
Bilirubin Urine: NEGATIVE
Glucose, UA: NEGATIVE mg/dL
Hgb urine dipstick: NEGATIVE
Ketones, ur: NEGATIVE mg/dL
Leukocytes,Ua: NEGATIVE
Nitrite: NEGATIVE
Protein, ur: NEGATIVE mg/dL
Specific Gravity, Urine: >=1.03 (ref 1.005–1.030)
pH: 6 (ref 5.0–8.0)

## 2024-05-11 LAB — PREGNANCY, URINE: Preg Test, Ur: NEGATIVE

## 2024-05-11 LAB — LIPASE, BLOOD: Lipase: 30 U/L (ref 11–51)

## 2024-05-11 MED ORDER — SODIUM CHLORIDE 0.9 % IV BOLUS
1000.0000 mL | Freq: Once | INTRAVENOUS | Status: AC
  Administered 2024-05-11: 1000 mL via INTRAVENOUS

## 2024-05-11 NOTE — Discharge Instructions (Signed)
 Your history, exam, and evaluation today seem consistent with either a viral diarrhea versus related to what you have been eating in the setting of your recent weight loss medication usage.  Your labs were overall reassuring and you appeared more hydrated after fluids.  We feel you are safe for discharge home given the reassuring workup and recommend with both your gastroenterologist and your PCP.  Please rest and stay hydrated.  If any symptoms change or worsen acutely, please return to the nearest emergency department.

## 2024-05-11 NOTE — ED Provider Notes (Signed)
 [place] [facility] Provider Note   CSN: [id number] Arrival date & time: 05/11/24  1638     History  Chief Complaint  Patient presents with   Diarrhea    Joyce Flynn is a 28 y.o. female.  The history is provided by the patient and medical records. No language interpreter was used.  Diarrhea Quality:  Watery Severity:  Severe Onset quality:  Gradual Duration:  6 days Timing:  Constant Progression:  Unchanged Relieved by:  Nothing Worsened by:  Nothing Ineffective treatments:  None tried Associated symptoms: no abdominal pain (brief pain yesterday now resolved), no chills, no recent cough, no diaphoresis, no fever, no headaches, no URI and no vomiting   Risk factors: no recent antibiotic use, no sick contacts and no suspicious food intake (chipotle)        Home Medications Prior to Admission medications   Medication Sig Start Date End Date Taking? Authorizing Provider  acetaminophen  (TYLENOL ) 325 MG tablet Take 2 tablets (650 mg total) by mouth every 6 (six) hours as needed. 12/25/22   Teddi Favors, DO  acyclovir  (ZOVIRAX ) 400 MG tablet Take 1 tablet (400 mg total) by mouth 3 (three) times daily. 01/07/19   Eldon Greenland, MD  azithromycin  (ZITHROMAX ) 250 MG tablet Take 2 tabs PO x 1 dose, then 1 tab PO QD x 4 days 10/22/23   Buena Carmine, NP  cetirizine  (ZYRTEC ) 10 MG tablet Take 1 tablet (10 mg total) by mouth at bedtime. 10/22/23   Buena Carmine, NP  cyclobenzaprine  (FLEXERIL ) 10 MG tablet Take 1 tablet (10 mg total) by mouth 2 (two) times daily as needed for muscle spasms. 05/26/22   Adel Aden, PA-C  dicyclomine  (BENTYL ) 20 MG tablet Take 1 tablet (20 mg total) by mouth 2 (two) times daily. 01/16/22   Caccavale, Sophia, PA-C  ibuprofen  (ADVIL ) 600 MG tablet Take 1 tablet (600 mg total) by mouth every 6 (six) hours as needed. 12/25/22   Teddi Favors, DO  metroNIDAZOLE  (FLAGYL ) 500 MG tablet Take 1 tablet (500 mg  total) by mouth 2 (two) times daily. One po bid x 7 days 01/07/19   Eldon Greenland, MD  ondansetron  (ZOFRAN -ODT) 4 MG disintegrating tablet Take 1 tablet (4 mg total) by mouth every 8 (eight) hours as needed for nausea or vomiting. 01/16/22   Caccavale, Sophia, PA-C  oxyCODONE  (ROXICODONE ) 5 MG immediate release tablet Take 1 tablet (5 mg total) by mouth every 4 (four) hours as needed for severe pain. 12/25/22   Teddi Favors, DO  valACYclovir (VALTREX) 500 MG tablet Take by mouth. 03/18/20   [provider]      Allergies    Lactose intolerance (gi)    Review of Systems   Review of Systems  Constitutional:  Negative for chills, diaphoresis and fever.  Respiratory:  Negative for cough, chest tightness and shortness of breath.   Cardiovascular:  Negative for chest pain and palpitations.  Gastrointestinal:  Positive for diarrhea. Negative for abdominal distention, abdominal pain (brief pain yesterday now resolved), constipation, nausea and vomiting.  Genitourinary:  Negative for dysuria.  Musculoskeletal:  Negative for back pain, neck pain and neck stiffness.  Skin:  Negative for rash and wound.  Neurological:  Negative for light-headedness, numbness and headaches.  All other systems reviewed and are negative.   Physical Exam Updated Vital Signs BP 126/76 (BP Location: Left Arm)   Pulse (!) 101   Temp 98.3 F (36.8 C) (Oral)  Resp 18   Ht 5\' 8"  (1.727 m)   Wt (!) 144.7 kg   LMP 04/10/2024 (Exact Date)   SpO2 100%   BMI 48.50 kg/m  Physical Exam Vitals and nursing note reviewed.  Constitutional:      General: She is not in acute distress.    Appearance: She is well-developed. She is not ill-appearing, toxic-appearing or diaphoretic.  HENT:     Head: Normocephalic and atraumatic.     Nose: No congestion or rhinorrhea.     Mouth/Throat:     Mouth: Mucous membranes are dry.     Pharynx: No oropharyngeal exudate or posterior oropharyngeal erythema.  Eyes:      Extraocular Movements: Extraocular movements intact.     Conjunctiva/sclera: Conjunctivae normal.     Pupils: Pupils are equal, round, and reactive to light.  Cardiovascular:     Rate and Rhythm: Normal rate and regular rhythm.     Heart sounds: No murmur heard. Pulmonary:     Effort: Pulmonary effort is normal. No respiratory distress.     Breath sounds: Normal breath sounds. No wheezing, rhonchi or rales.  Chest:     Chest wall: No tenderness.  Abdominal:     General: Abdomen is flat.     Palpations: Abdomen is soft.     Tenderness: There is no abdominal tenderness. There is no right CVA tenderness, left CVA tenderness, guarding or rebound.  Musculoskeletal:        General: No swelling or tenderness.     Cervical back: Neck supple. No tenderness.     Right lower leg: No edema.     Left lower leg: No edema.  Skin:    General: Skin is warm and dry.     Capillary Refill: Capillary refill takes less than 2 seconds.     Findings: No erythema or rash.  Neurological:     General: No focal deficit present.     Mental Status: She is alert.  Psychiatric:        Mood and Affect: Mood normal.    ED Results / Procedures / Treatments   Labs (all labs ordered are listed, but only abnormal results are displayed) Labs Reviewed  COMPREHENSIVE METABOLIC PANEL WITH GFR - Abnormal; Notable for the following components:      Result Value   CO2 20 (*)    Total Protein 8.4 (*)    All other components within normal limits  LIPASE, BLOOD  CBC  URINALYSIS, ROUTINE W REFLEX MICROSCOPIC  PREGNANCY, URINE    EKG None  Radiology No results found.  Procedures Procedures    Medications Ordered in ED Medications  sodium chloride  0.9 % bolus 1,000 mL (0 mLs Intravenous Stopped 05/11/24 1934)    ED Course/ Medical Decision Making/ A&P                                 Medical Decision Making Amount and/or Complexity of Data Reviewed Labs: ordered.    Joyce Flynn is a 28 y.o. female  with no significant past medical history who presents for recurrent diarrhea.  According to patient, she has been on weight loss medication for the last several months and has not had the diarrheal side effects that she expected.  She said that at the beginning of the month she had 4 to 5 days of diarrhea after some suspicious kickback jacks food.  That then resolved and she had a  week or 2 of normal bowels but then had [employer]ipot[employer] last week and has had about 6 days of diarrhea again.  She denies any blood in her stools.  Denies nausea or vomiting at this time.  She had some nausea medicine several days ago that helps with some mild nausea.  She reports no fevers or chills.  She has less appetite because she says that anything she eats goes right through her.  She denies any urinary changes or dysuria.  She denies any vaginal complaint such as vaginal bleeding or vaginal discharge.  She reports yesterday she had a cramp in her left abdomen and felt a knot when she pushed on it but then it resolved.  She reports no abdominal pain otherwise and has no abdominal pain today.  She reports no back or flank pain.  Denies any headache or neck pain and denies other complaints.  Patient otherwise resting comfortably.  On exam, patient has dry mucous membranes.  Chest and abdomen nontender.  Lungs are clear.  Good bowel sounds.  Flanks and back nontender.  Patient has no focal neurologic deficits.  She is resting comfortably.  She was slightly tachycardic on my evaluation and does appear slightly dehydrated.  Clinically I do suspect some mild dehydration due to 6 days of persistent diarrhea.  Patient had some screening labs in triage that were overall reassuring.  Urinalysis was also done that did not show UTI and she is not pregnant.  Electrolytes were not significantly abnormal and she has no AKI.  We had a shared decision conversation and given her lack of any abdominal pain or abdominal tenderness at this time we agreed  to hold on CT ab pelvis we have low suspicion for a diverticulitis type picture and patient agrees.  She has no pain and thus we will hold on pain medicine and she reports no nausea now so we will hold on further nausea medicine for now she has taken at home.  She does want some IV fluids for the diarrhea and dehydration so we will give her some fluids.  We also discussed stool testing and she said that she already had a negative stool evaluation that was negative for C. difficile during her last episode and she has had no recent antibiotics.  She does not want stool testing.  After fluids if she is feeling better, dissipate discharge home.  I suspect may related to some foods that in the setting of her recent weight loss medication use may have caused a diarrheal state.  We also considered being a viral illness.  The patient tolerates the fluids and is feeling better, anticipate discharge for outpatient follow-up.   8:26 PM Urinalysis returned reassuring.  No evidence of UTI.  She is not pregnant.  Patient felt much better after getting some fluids.  She will be discharged with plan to follow-up with her gastroenterologist.  She agreed with increasing hydration and follow-up.  As she reported a reassuring stool pathogen evaluation recently, will hold on further testing now.  Patient agreed with plan of care and was discharged in good condition          Final Clinical Impression(s) / ED Diagnoses Final diagnoses:  Diarrhea, unspecified type    Rx / DC Orders ED Discharge Orders     None       Clinical Impression: 1. Diarrhea, unspecified type     Disposition: Discharge  Condition: Good  I have discussed the results, Dx and Tx plan with  the pt(& family if present). He/she/they expressed understanding and agree(s) with the plan. Discharge instructions discussed at great length. Strict return precautions discussed and pt &/or family have verbalized understanding of the  instructions. No further questions at time of discharge.    New Prescriptions   No medications on file    Follow Up: your gastroenterologist and PCP        Anaiz Qazi, Marine Sia, MD 05/11/24 2029

## 2024-05-11 NOTE — ED Triage Notes (Signed)
 Pt POV c/o diarrhea since 05/05/24.  Reports good po intake. Denies n/v/fever.   Also reports feeling a knot on LUQ.
